# Patient Record
Sex: Female | Born: 1975
Health system: Southern US, Community
[De-identification: ages and names within clinical notes are randomized; demographics above are authoritative.]

---

## 2002-08-13 ENCOUNTER — Ambulatory Visit (HOSPITAL_COMMUNITY): Admission: RE | Admit: 2002-08-13 | Discharge: 2002-08-13 | Payer: Self-pay | Admitting: Family Medicine

## 2003-07-28 ENCOUNTER — Other Ambulatory Visit: Admission: RE | Admit: 2003-07-28 | Discharge: 2003-07-28 | Payer: Self-pay | Admitting: Family Medicine

## 2004-08-11 ENCOUNTER — Encounter: Admission: RE | Admit: 2004-08-11 | Discharge: 2004-08-11 | Payer: Self-pay | Admitting: Family Medicine

## 2004-10-07 ENCOUNTER — Other Ambulatory Visit: Admission: RE | Admit: 2004-10-07 | Discharge: 2004-10-07 | Payer: Self-pay | Admitting: Obstetrics and Gynecology

## 2005-11-22 ENCOUNTER — Inpatient Hospital Stay (HOSPITAL_COMMUNITY): Admission: AD | Admit: 2005-11-22 | Discharge: 2005-11-25 | Payer: Self-pay | Admitting: Obstetrics and Gynecology

## 2008-05-16 ENCOUNTER — Inpatient Hospital Stay (HOSPITAL_COMMUNITY): Admission: RE | Admit: 2008-05-16 | Discharge: 2008-05-18 | Payer: Self-pay | Admitting: Obstetrics and Gynecology

## 2010-08-16 LAB — CBC
HCT: 35.8 % — ABNORMAL LOW (ref 36.0–46.0)
MCHC: 33.2 g/dL (ref 30.0–36.0)
Platelets: 139 10*3/uL — ABNORMAL LOW (ref 150–400)
RDW: 13.7 % (ref 11.5–15.5)

## 2010-08-16 LAB — URINALYSIS, ROUTINE W REFLEX MICROSCOPIC
Hgb urine dipstick: NEGATIVE
Ketones, ur: 15 mg/dL — AB
Leukocytes, UA: NEGATIVE
Specific Gravity, Urine: 1.02 (ref 1.005–1.030)
pH: 6.5 (ref 5.0–8.0)

## 2010-08-16 LAB — URINE MICROSCOPIC-ADD ON

## 2010-08-16 LAB — RPR: RPR Ser Ql: NONREACTIVE

## 2010-08-16 LAB — CCBB MATERNAL DONOR DRAW

## 2010-09-14 NOTE — Op Note (Signed)
April Perkins, April Perkins             ACCOUNT NO.:  [id number]   MEDICAL RECORD NO.:  [id number]          PATIENT TYPE:  INP   LOCATION:  9107                          FACILITY:  [facility]   PHYSICIAN:  Randye Lobo, M.D.   DATE OF BIRTH:  25-Mar-1976   DATE OF PROCEDURE:  DATE OF DISCHARGE:                               OPERATIVE REPORT   PREOPERATIVE DIAGNOSES:  1. Intrauterine gestation at 16 plus 2 weeks.  2. History of prior cesarean section, desires repeat cesarean section.   POSTOPERATIVE DIAGNOSES:  1. Intrauterine gestation at 37 plus 2 weeks.  2. History of prior cesarean section, desires repeat cesarean section.   PROCEDURE:  Primary low segment transverse cesarean section.   SURGEON:  Randye Lobo, MD   ASSISTANT:  Luvenia Redden, MD   ANESTHESIA:  Spinal.   IV FLUIDS:  __________.   ESTIMATED BLOOD LOSS:  800 mL.   URINE OUTPUT:  Per Foley catheter.   COMPLICATIONS:  None.   INDICATIONS FOR PROCEDURE:  The patient is a 35 year old gravida 2, para  1-0-0-1 Caucasian female at 44 plus 2 weeks' gestation who presents for  a repeat cesarean section.  The patient's antepartum course has been  remarkable for some right lower extremity varicosities.  A plan is now  made to proceed with a repeat cesarean section after risks, benefits,  and alternatives are reviewed.   FINDINGS:  A viable female was delivered at 7:43 a.m. with Apgars of 9  at one minute and 9 at five minutes.  The weight was 8 pounds 14 ounces.  The position was right occiput anterior.  The amniotic fluid was clear.  The placenta had a normal insertion of a three-vessel cord.  There was a  body cord x1 which was loose.  The uterus, tubes, and ovaries were  unremarkable.   SPECIMEN:  None.   PROCEDURE:  The patient was reidentified in the preoperative hold area.  The patient received TED hose for DVT prophylaxis and she received Ancef  1 g IV for preoperative antibiotics.   In the operating room, a  spinal anesthetic was administered and the  patient was then placed in the supine position with a left lateral tilt.  The abdomen was sterilely prepped and a Foley catheter placed inside the  bladder.  She was then sterilely draped.   A Pfannenstiel incision was created sharply with the scalpel.  The  incision was carried down to the fascia with a scalpel and with  monopolar cautery for hemostasis.  The fascia was incised in the midline  and the incision was extended bilaterally.  The rectus muscles were  separated from the fascia superiorly and inferiorly.  The rectus muscles  were sharply divided in the midline.  The parietal peritoneum was  elevated with two hemostat clamps and entered sharply.  The peritoneal  incision was extended cranially and caudally.   The lower uterine segment was exposed.  The bladder flap was sharply  created.  A transverse lower uterine segment incision was created  sharply with a scalpel and extended bluntly.  A hand was inserted  through the uterine incision, and the vertex was elevated slightly.  There was some tissue dystocia of the right rectus muscle, and this was  therefore partially bisected in a sharp fashion.  The vertex was then  delivered without difficulty with the assistance of fundal pressure.  The remainder of the newborn was delivered.  The nares and mouth were  suctioned.  The umbilical cord was doubly clamped and cut and the  newborn was carried over to the awaiting pediatricians in vigorous  condition.   The placenta was manually extracted.  The uterine cavity was wiped  clean, and there were no remaining products of conception appreciated.   The uterus was exteriorized at this time, and it was closed with a  double layer closure of #1 chromic.  The first was a running locked  layer and the second was an imbricating layer.  Hemostasis was good at  this time and the uterus was therefore returned to the peritoneal  cavity, which was  irrigated and suctioned.   The abdomen was closed at this time.  The parietal peritoneum was closed  with a running suture of 3-0 Vicryl.  The rectus muscles were  reapproximated using a combination of figure-of-eight sutures and 0  figure-of-eight sutures and simple interrupted sutures of #1 chromic.  The bisected portion of the rectus muscle was repaired in this fashion  and the rectus muscles were brought together in the midline.   The fascia was closed with a running suture of 0 Vicryl.  The  subcutaneous layer was irrigated and suctioned and made hemostatic with  monopolar cautery.  It was closed with interrupted sutures of 2-0 plain  gut suture.  Staples were placed over the skin.  This concluded the  patient's procedure.  There were no complications.  All needle,  instrument, and sponge counts were correct.      Randye Lobo, M.D.  Electronically Signed     BES/MEDQ  D:  05/16/2008  T:  05/16/2008  Job:  [id number]

## 2010-09-17 NOTE — Op Note (Signed)
April Perkins, April Perkins             ACCOUNT NO.:  [id number]   MEDICAL RECORD NO.:  [id number]          PATIENT TYPE:  INP   LOCATION:  9112                          FACILITY:  [facility]   PHYSICIAN:  Miguel Aschoff, M.D.       DATE OF BIRTH:  06-20-1975   DATE OF PROCEDURE:  11/22/2005  DATE OF DISCHARGE:                                 OPERATIVE REPORT   PREOPERATIVE DIAGNOSIS:  Intrauterine pregnancy at 41-5/7 weeks, suspected  macrosomia, early labor.   POSTOPERATIVE DIAGNOSIS:  Intrauterine pregnancy at 41-5/7 weeks, suspected  macrosomia, early labor with viable female infant Apgar 9/9, weighing 8  pounds 10 ounces.   PROCEDURE:  Primary low flap transverse cesarean section.   SURGEON:  Dr. Miguel Aschoff.   ANESTHESIA:  Spinal.   COMPLICATIONS:  None.   JUSTIFICATION:  The patient is a 35 year old white female gravida 1, para 0  with estimated date of confinement of 11/10/2005.  The patient was noted to  be 41 and 5/7 weeks and had undergone an ultrasound evaluation which  revealed a estimated fetal weight 9 pounds 10 ounces.  The patient has been  scheduled for primary cesarean section for suspected macrosomia 11/23/2032  but went into labor spontaneously on November 22, 2005.  With the onset of labor  she is being taken to the operating room at this time to undergo cesarean  section for the suspected macrosomia.  The risks and benefits have been  discussed with the patient of the surgical procedure as well as the  uncertainty of estimated fetal weights with ultrasound.   PROCEDURE:  The patient is taken to the operating room, placed in sitting  position and spinal anesthesia was administered without difficulty.  She was  then placed in supine position deviated to the left and prepped and draped  in usual sterile fashion.  Foley catheter was inserted.  The patient then  prepped, draped in usual sterile fashion.  A Pfannenstiel incision was made  extending down through subcutaneous  tissue with bleeding points being  clamped and coagulation as they were encountered.  Fascia was then  identified, incised transversely and separated from the underlying rectus  muscles.  Rectus muscles were divided in midline.  Peritoneum was then found  and entered carefully avoiding underlying structures.  Bladder flap was then  created and protected with a bladder blade.  An elliptical transverse  incision was made into lower uterine segment, amniotic cavity was entered.  Clear fluid was obtained and at this point the patient was delivered of a  viable female infant Apgar 9 at one minute and 9 at five minutes from the  vertex LOA position.  Baby was noted to have an excessively long umbilical  cord but otherwise, the baby was vigorous and handed to the neonatal team in  attendance.  Cord bloods were obtained for appropriate studies.  The  placenta was then delivered.  The uterus was then evacuated and remaining  products of conception.  At this point the angles of the uterine incision  were ligated using figure-of-eight sutures of #1 Vicryl.  Then the uterus  was closed in layers.  The first layer was running interlocking suture of #1  Vicryl followed by an imbricating suture of #1 Vicryl.  Bladder flap was  reapproximated using running continuous 2-0 Vicryl suture.  At this point  lap counts, instrument counts taken found to be correct.  Tubes and ovaries  were inspected noted to be within normal limits.  Then the abdomen was  closed.  The parietal peritoneum was closed using running continuous 0  Vicryl suture.  Rectus muscles were reapproximated using running continuous  0 Vicryl suture.  The fascia was then closed using two sutures of 0 Vicryl  each starting lateral fascial angles and meeting in midline.  Subcutaneous  tissue was closed  using interrupted 2-0 Vicryl sutures and the skin incision was closed using  staples.  The estimated blood loss from the procedure was  approximately 600  to 700 mL.  The patient tolerated the procedure well went to the recovery  room in satisfactory condition.      Miguel Aschoff, M.D.  Electronically Signed     AR/MEDQ  D:  11/22/2005  T:  11/23/2005  Job:  [id number]

## 2010-09-17 NOTE — Discharge Summary (Signed)
NAMEKAMYIAH, April Perkins             ACCOUNT NO.:  [id number]   MEDICAL RECORD NO.:  [id number]          PATIENT TYPE:  INP   LOCATION:  9112                          FACILITY:  [facility]   PHYSICIAN:  Malva Limes, M.D.    DATE OF BIRTH:  1976-02-28   DATE OF ADMISSION:  11/22/2005  DATE OF DISCHARGE:  11/25/2005                                 DISCHARGE SUMMARY   FINAL DIAGNOSES:  1. Intrauterine pregnancy at 41-5/[redacted] weeks gestation.  2. Macrosomia.  3. Early labor.   PROCEDURE:  Primary low flap transverse cesarean section.  Surgeon Dr. Miguel Aschoff.  Complications none.   This 35 year old, G1, P0 presents at 41-5/[redacted] weeks gestation in early labor.  The patient had had an ultrasound revealing an estimated weight in the  office of 9 pounds and 10 ounces was scheduled for a cesarean section on the  25th. She happened to go into  labor spontaneously on the 24th and therefore a discussion held with the  patient and they decided to proceed with a cesarean section at this time.  The patient's antepartum course up to this point had been uncomplicated. The  patient did have a positive group B strep culture obtained in our office at  36 weeks. She was admitted. She was taken to the operating room on November 22, 2005 by Dr. Miguel Aschoff where a primary low flap transverse cesarean section  was performed with the delivery of an 8 pound 10 ounce female infant with  Apgar's of 9 and 9. The delivery went without complications. The patient's  postoperative course was benign without any significant fevers.  She was  felt ready for discharge on postoperative day #3. She was sent home on a  regular diet, told to decrease activities, told to continue her prenatal  vitamins and was given Percocet 1-2 every 4-6 hours as needed for the pain,  told she could use ibuprofen up to 600 mg every  6 hours as needed for pain  and was to follow-up in our office in 4 weeks.  Precautions were reviewed.   LABS ON DISCHARGE:   The patient had a hemoglobin of 11.1, white blood cell  count of 12.6, platelets of 150,000.      Leilani Able, P.A.-C.    ______________________________  Malva Limes, M.D.    MB/MEDQ  D:  12/29/2005  T:  12/30/2005  Job:  [id number]

## 2010-09-17 NOTE — Discharge Summary (Signed)
NAMELAKEITHIA, April Perkins             ACCOUNT NO.:  [id number]   MEDICAL RECORD NO.:  [id number]          PATIENT TYPE:  INP   LOCATION:  9107                          FACILITY:  [facility]   PHYSICIAN:  Carrington Clamp, M.D. DATE OF BIRTH:  1975/12/10   DATE OF ADMISSION:  05/16/2008  DATE OF DISCHARGE:  05/18/2008                               DISCHARGE SUMMARY   FINAL DIAGNOSIS:  Intrauterine gestation at 39-2/7th weeks and history  of prior cesarean section.  The patient desires repeat cesarean section.   PROCEDURE:  Primary low-segment transverse cesarean section.   SURGEON:  Randye Lobo, MD   ASSISTANT:  Luvenia Redden, MD   COMPLICATIONS:  None.   This 35 year old G2, [name] presents at 39-2/7th weeks' gestation for  repeat cesarean section.  The patient had a prior cesarean section with  her last pregnancy and desires a repeat with this pregnancy as well.  The patient's antepartum course up to this point had been mostly  uncomplicated, besides some right lower extremity varicosities.  The  patient was taken to the operating room on May 16, 2008, by Dr.  Conley Simmonds, where a primary low-segment transverse cesarean section was  performed with the delivery of an 8 pounds 14 ounces female infant with  Apgars of 9 and 9.  The delivery went without complications.  The  patient's postoperative course was benign without any significant  fevers.  She did have some postoperative anemia, was started on iron  during her hospital course.  She was felt ready for discharge on  postoperative day #2.  She was sent home on a regular diet, told to  decrease activities, told to continue her vitamins, and her iron  supplement twice daily.  She was given a prescription for Darvocet-N 100  to take 1 every 4-6 hours as needed for pain, told she could use Motrin  up to 800 mg 1 every 8 hours as needed for pain, and was to follow up in  our office Monday for her staple removal.  Instructions and  precautions  were reviewed with the patient.   LABS ON DISCHARGE:  The patient had a hemoglobin of 8.5, white blood  cell count of 8.8, and platelets of 139,000.      Leilani Able, P.A.-C.      Carrington Clamp, M.D.  Electronically Signed    MB/MEDQ  D:  06/11/2008  T:  06/12/2008  Job:  [id number]

## 2013-05-08 ENCOUNTER — Other Ambulatory Visit: Payer: Self-pay | Admitting: Physician Assistant

## 2013-05-08 ENCOUNTER — Other Ambulatory Visit (HOSPITAL_COMMUNITY)
Admission: RE | Admit: 2013-05-08 | Discharge: 2013-05-08 | Disposition: A | Discharge status: Home / Self Care | Payer: Managed Care, Other (non HMO) | Source: Ambulatory Visit | Attending: Family Medicine | Admitting: Family Medicine

## 2013-05-08 DIAGNOSIS — Z124 Encounter for screening for malignant neoplasm of cervix: Secondary | ICD-10-CM | POA: Insufficient documentation

## 2015-08-06 DIAGNOSIS — M79671 Pain in right foot: Secondary | ICD-10-CM | POA: Diagnosis not present

## 2015-09-13 DIAGNOSIS — H6980 Other specified disorders of Eustachian tube, unspecified ear: Secondary | ICD-10-CM | POA: Diagnosis not present

## 2015-10-01 DIAGNOSIS — N920 Excessive and frequent menstruation with regular cycle: Secondary | ICD-10-CM | POA: Diagnosis not present

## 2016-01-07 DIAGNOSIS — D2271 Melanocytic nevi of right lower limb, including hip: Secondary | ICD-10-CM | POA: Diagnosis not present

## 2016-01-07 DIAGNOSIS — D225 Melanocytic nevi of trunk: Secondary | ICD-10-CM | POA: Diagnosis not present

## 2016-01-07 DIAGNOSIS — L719 Rosacea, unspecified: Secondary | ICD-10-CM | POA: Diagnosis not present

## 2016-01-07 DIAGNOSIS — L821 Other seborrheic keratosis: Secondary | ICD-10-CM | POA: Diagnosis not present

## 2016-01-15 DIAGNOSIS — G5751 Tarsal tunnel syndrome, right lower limb: Secondary | ICD-10-CM | POA: Diagnosis not present

## 2016-02-11 DIAGNOSIS — G5751 Tarsal tunnel syndrome, right lower limb: Secondary | ICD-10-CM | POA: Diagnosis not present

## 2016-02-11 DIAGNOSIS — Z23 Encounter for immunization: Secondary | ICD-10-CM | POA: Diagnosis not present

## 2016-02-11 DIAGNOSIS — G5702 Lesion of sciatic nerve, left lower limb: Secondary | ICD-10-CM | POA: Diagnosis not present

## 2016-02-26 DIAGNOSIS — Z111 Encounter for screening for respiratory tuberculosis: Secondary | ICD-10-CM | POA: Diagnosis not present

## 2016-03-05 DIAGNOSIS — H109 Unspecified conjunctivitis: Secondary | ICD-10-CM | POA: Diagnosis not present

## 2016-08-16 DIAGNOSIS — Z131 Encounter for screening for diabetes mellitus: Secondary | ICD-10-CM | POA: Diagnosis not present

## 2016-08-16 DIAGNOSIS — Z13220 Encounter for screening for lipoid disorders: Secondary | ICD-10-CM | POA: Diagnosis not present

## 2016-08-17 ENCOUNTER — Other Ambulatory Visit: Payer: Self-pay | Admitting: Family Medicine

## 2016-08-17 ENCOUNTER — Other Ambulatory Visit (HOSPITAL_COMMUNITY)
Admission: RE | Admit: 2016-08-17 | Discharge: 2016-08-17 | Disposition: A | Discharge status: Home / Self Care | Payer: BLUE CROSS/BLUE SHIELD | Source: Ambulatory Visit | Attending: Family Medicine | Admitting: Family Medicine

## 2016-08-17 DIAGNOSIS — Z818 Family history of other mental and behavioral disorders: Secondary | ICD-10-CM | POA: Diagnosis not present

## 2016-08-17 DIAGNOSIS — Z124 Encounter for screening for malignant neoplasm of cervix: Secondary | ICD-10-CM | POA: Diagnosis not present

## 2016-08-17 DIAGNOSIS — N920 Excessive and frequent menstruation with regular cycle: Secondary | ICD-10-CM | POA: Diagnosis not present

## 2016-08-17 DIAGNOSIS — Z0000 Encounter for general adult medical examination without abnormal findings: Secondary | ICD-10-CM | POA: Diagnosis not present

## 2016-08-18 LAB — CYTOLOGY - PAP: Diagnosis: NEGATIVE

## 2016-08-23 DIAGNOSIS — J0140 Acute pansinusitis, unspecified: Secondary | ICD-10-CM | POA: Diagnosis not present

## 2016-08-23 DIAGNOSIS — R05 Cough: Secondary | ICD-10-CM | POA: Diagnosis not present

## 2016-12-07 DIAGNOSIS — R5383 Other fatigue: Secondary | ICD-10-CM | POA: Diagnosis not present

## 2016-12-07 DIAGNOSIS — Z8349 Family history of other endocrine, nutritional and metabolic diseases: Secondary | ICD-10-CM | POA: Diagnosis not present

## 2016-12-07 DIAGNOSIS — R221 Localized swelling, mass and lump, neck: Secondary | ICD-10-CM | POA: Diagnosis not present

## 2016-12-08 ENCOUNTER — Other Ambulatory Visit: Payer: Self-pay | Admitting: Family Medicine

## 2016-12-08 DIAGNOSIS — R221 Localized swelling, mass and lump, neck: Secondary | ICD-10-CM

## 2016-12-15 ENCOUNTER — Ambulatory Visit
Admission: RE | Admit: 2016-12-15 | Discharge: 2016-12-15 | Disposition: A | Discharge status: Home / Self Care | Payer: BLUE CROSS/BLUE SHIELD | Source: Ambulatory Visit | Attending: Family Medicine | Admitting: Family Medicine

## 2016-12-15 DIAGNOSIS — R221 Localized swelling, mass and lump, neck: Secondary | ICD-10-CM

## 2016-12-15 DIAGNOSIS — E042 Nontoxic multinodular goiter: Secondary | ICD-10-CM | POA: Diagnosis not present

## 2017-01-17 DIAGNOSIS — D2271 Melanocytic nevi of right lower limb, including hip: Secondary | ICD-10-CM | POA: Diagnosis not present

## 2017-01-17 DIAGNOSIS — D225 Melanocytic nevi of trunk: Secondary | ICD-10-CM | POA: Diagnosis not present

## 2017-01-17 DIAGNOSIS — L719 Rosacea, unspecified: Secondary | ICD-10-CM | POA: Diagnosis not present

## 2017-01-17 DIAGNOSIS — L821 Other seborrheic keratosis: Secondary | ICD-10-CM | POA: Diagnosis not present

## 2017-03-04 DIAGNOSIS — Z23 Encounter for immunization: Secondary | ICD-10-CM | POA: Diagnosis not present

## 2017-11-22 DIAGNOSIS — J208 Acute bronchitis due to other specified organisms: Secondary | ICD-10-CM | POA: Diagnosis not present

## 2017-12-01 DIAGNOSIS — R0602 Shortness of breath: Secondary | ICD-10-CM | POA: Diagnosis not present

## 2017-12-01 DIAGNOSIS — J209 Acute bronchitis, unspecified: Secondary | ICD-10-CM | POA: Diagnosis not present

## 2018-02-01 DIAGNOSIS — D225 Melanocytic nevi of trunk: Secondary | ICD-10-CM | POA: Diagnosis not present

## 2018-02-01 DIAGNOSIS — D2271 Melanocytic nevi of right lower limb, including hip: Secondary | ICD-10-CM | POA: Diagnosis not present

## 2018-02-01 DIAGNOSIS — L719 Rosacea, unspecified: Secondary | ICD-10-CM | POA: Diagnosis not present

## 2018-02-01 DIAGNOSIS — Z23 Encounter for immunization: Secondary | ICD-10-CM | POA: Diagnosis not present

## 2018-02-13 ENCOUNTER — Other Ambulatory Visit: Payer: Self-pay | Admitting: Family Medicine

## 2018-02-13 DIAGNOSIS — Z1231 Encounter for screening mammogram for malignant neoplasm of breast: Secondary | ICD-10-CM

## 2018-03-05 DIAGNOSIS — J4530 Mild persistent asthma, uncomplicated: Secondary | ICD-10-CM | POA: Diagnosis not present

## 2018-03-05 DIAGNOSIS — M79671 Pain in right foot: Secondary | ICD-10-CM | POA: Diagnosis not present

## 2018-03-05 DIAGNOSIS — J301 Allergic rhinitis due to pollen: Secondary | ICD-10-CM | POA: Diagnosis not present

## 2018-03-05 DIAGNOSIS — Z0000 Encounter for general adult medical examination without abnormal findings: Secondary | ICD-10-CM | POA: Diagnosis not present

## 2018-03-05 DIAGNOSIS — Z131 Encounter for screening for diabetes mellitus: Secondary | ICD-10-CM | POA: Diagnosis not present

## 2018-03-07 ENCOUNTER — Other Ambulatory Visit: Payer: Self-pay | Admitting: Family Medicine

## 2018-03-07 DIAGNOSIS — E041 Nontoxic single thyroid nodule: Secondary | ICD-10-CM

## 2018-03-12 ENCOUNTER — Ambulatory Visit
Admission: RE | Admit: 2018-03-12 | Discharge: 2018-03-12 | Disposition: A | Discharge status: Home / Self Care | Payer: BLUE CROSS/BLUE SHIELD | Source: Ambulatory Visit | Attending: Family Medicine | Admitting: Family Medicine

## 2018-03-12 DIAGNOSIS — E041 Nontoxic single thyroid nodule: Secondary | ICD-10-CM

## 2018-03-21 ENCOUNTER — Ambulatory Visit
Admission: RE | Admit: 2018-03-21 | Discharge: 2018-03-21 | Disposition: A | Discharge status: Home / Self Care | Payer: BLUE CROSS/BLUE SHIELD | Source: Ambulatory Visit | Attending: Family Medicine | Admitting: Family Medicine

## 2018-03-21 DIAGNOSIS — Z1231 Encounter for screening mammogram for malignant neoplasm of breast: Secondary | ICD-10-CM

## 2018-03-26 ENCOUNTER — Encounter: Payer: Self-pay | Admitting: Podiatry

## 2018-03-26 ENCOUNTER — Ambulatory Visit (INDEPENDENT_AMBULATORY_CARE_PROVIDER_SITE_OTHER): Payer: BLUE CROSS/BLUE SHIELD | Admitting: Podiatry

## 2018-03-26 ENCOUNTER — Ambulatory Visit (INDEPENDENT_AMBULATORY_CARE_PROVIDER_SITE_OTHER): Payer: BLUE CROSS/BLUE SHIELD

## 2018-03-26 DIAGNOSIS — M722 Plantar fascial fibromatosis: Secondary | ICD-10-CM

## 2018-03-26 DIAGNOSIS — G5751 Tarsal tunnel syndrome, right lower limb: Secondary | ICD-10-CM

## 2018-03-26 MED ORDER — TRIAMCINOLONE ACETONIDE 10 MG/ML IJ SUSP
10.0000 mg | Freq: Once | INTRAMUSCULAR | Status: AC
  Administered 2018-03-26: 10 mg

## 2018-03-26 MED ORDER — MELOXICAM 15 MG PO TABS: 15.0000 mg | ORAL_TABLET | Freq: Every day | ORAL | 0 refills | Status: AC

## 2018-03-26 NOTE — Progress Notes (Signed)
Subjective:    Patient ID: April Perkins, female    DOB: 12-Nov-1975, 42 y.o.   MRN: [id number]  HPI 42 year old female presents the office today for concerns of pain to the right heel.  She states that she has pain in the bottom of her heel.  Is been ongoing for last couple years and she was also told that she had a tarsal tunnel previously.  She states that she gets intermittent discomfort and she has just been "living with it".  She did previously discuss this with her primary care physician.  She denies any recent injury or trauma.  She has some numbness to the second third toes on the right foot but overall she has no other areas of numbness or tingling or burning.  No significant swelling.  The pain does not wake her up at night.  She has no other concerns.   Review of Systems  All other systems reviewed and are negative.  History reviewed. No pertinent past medical history.  History reviewed. No pertinent surgical history.   Current Outpatient Medications:  .  Acetaminophen (TYLENOL PO), Take by mouth., Disp: , Rfl:  .  guaiFENesin (MUCINEX PO), Take by mouth., Disp: , Rfl:  .  Ibuprofen (ADVIL PO), Take by mouth., Disp: , Rfl:  .  drospirenone-ethinyl estradiol (YASMIN,ZARAH,SYEDA) 3-0.03 MG tablet, TAKE 1 TABLET BY MOUTH ONCE A DAY - ACTIVE PILLS TAKEN IN CONTINUOUS FASHION, Disp: , Rfl: 1 .  meloxicam (MOBIC) 15 MG tablet, Take 1 tablet (15 mg total) by mouth daily., Disp: 30 tablet, Rfl: 0 .  montelukast (SINGULAIR) 10 MG tablet, Take 10 mg by mouth daily., Disp: , Rfl: 12  Allergies  Allergen Reactions  . Codeine          Objective:   Physical Exam  General: AAO x3, NAD  Dermatological: Skin is warm, dry and supple bilateral. Nails x 10 are well manicured; remaining integument appears unremarkable at this time. There are no open sores, no preulcerative lesions, no rash or signs of infection present.  Vascular: Dorsalis Pedis artery and Posterior Tibial artery  pedal pulses are 2/4 bilateral with immedate capillary fill time. Pedal hair growth present. No varicosities and no lower extremity edema present bilateral. There is no pain with calf compression, swelling, warmth, erythema.   Neruologic: Grossly intact via light touch bilateral. Vibratory intact via tuning fork bilateral. Protective threshold with Semmes Wienstein monofilament intact to all pedal sites bilateral.  Mildly positive Tinel sign on the right side.  Negative on the left side.  Some subjective numbness to the second and third toes but overall sensation intact to the toes.  Musculoskeletal: Tenderness to palpation along the plantar medial tubercle of the calcaneus at the insertion of plantar fascia on the right foot. There is no pain along the course of the plantar fascia within the arch of the foot. Plantar fascia appears to be intact. There is no pain with lateral compression of the calcaneus or pain with vibratory sensation. There is no pain along the course or insertion of the achilles tendon. No other areas of tenderness to bilateral lower extremities.  There is no palpable neuroma identified no pain to the interspaces.  muscular strength 5/5 in all groups tested bilateral.  Cavus foot type  Gait: Unassisted, Nonantalgic.      Assessment & Plan:  42 year old female right heel pain, plantar fasciitis with concern for tarsal tunnel  -Treatment options discussed including all alternatives, risks, and complications -Etiology of symptoms were  discussed -X-rays were obtained and reviewed with the patient.  There is no evidence of acute fracture or stress fracture identified today. -I do think she has plantar fasciitis and hopefully this is causing some entrapment of the nerve and this will resolve.  Unfortunately she had nerve issues for the last couple years and we discussed the longer that nerve issues of her symptomatic it is difficult to treat.  Given focus on plantar fascial treatment but  she has no improvement we will get a nerve conduction test. -Steroid injection performed right heel.  See procedure note below.  Also consider injection of the tarsal tunnel symptoms persist. -Prescribed mobic. Discussed side effects of the medication and directed to stop if any are to occur and call the office.  -Stretching, icing daily. -Discussed shoe modifications and orthotics.  Check insurance coverage.  Given her cavus foot type I think she will benefit more from custom orthotic. She has attempted OTC inserts.  -Plantar fascial brace dispensed.  Procedure: Injection Tendon/Ligament Discussed alternatives, risks, complications and verbal consent was obtained.  Location: Right plantar fascia at the glabrous junction; medial approach. Skin Prep: Alcohol. Injectate: 0.5cc 0.5% marcaine plain, 0.5 cc 2% lidocaine plain and, 1 cc kenalog 10. Disposition: Patient tolerated procedure well. Injection site dressed with a band-aid.  Post-injection care was discussed and return precautions discussed.   Vivi Barrack DPM

## 2018-03-26 NOTE — Patient Instructions (Signed)

## 2018-03-28 DIAGNOSIS — M722 Plantar fascial fibromatosis: Secondary | ICD-10-CM | POA: Insufficient documentation

## 2018-04-16 ENCOUNTER — Ambulatory Visit: Payer: BLUE CROSS/BLUE SHIELD | Admitting: Podiatry

## 2019-02-14 DIAGNOSIS — D225 Melanocytic nevi of trunk: Secondary | ICD-10-CM | POA: Diagnosis not present

## 2019-02-14 DIAGNOSIS — D485 Neoplasm of uncertain behavior of skin: Secondary | ICD-10-CM | POA: Diagnosis not present

## 2019-02-14 DIAGNOSIS — Z808 Family history of malignant neoplasm of other organs or systems: Secondary | ICD-10-CM | POA: Diagnosis not present

## 2019-02-14 DIAGNOSIS — Z23 Encounter for immunization: Secondary | ICD-10-CM | POA: Diagnosis not present

## 2019-02-14 DIAGNOSIS — L719 Rosacea, unspecified: Secondary | ICD-10-CM | POA: Diagnosis not present

## 2019-02-14 DIAGNOSIS — D224 Melanocytic nevi of scalp and neck: Secondary | ICD-10-CM | POA: Diagnosis not present

## 2019-03-18 ENCOUNTER — Other Ambulatory Visit: Payer: Self-pay | Admitting: Family Medicine

## 2019-03-18 DIAGNOSIS — Z0000 Encounter for general adult medical examination without abnormal findings: Secondary | ICD-10-CM | POA: Diagnosis not present

## 2019-03-18 DIAGNOSIS — E041 Nontoxic single thyroid nodule: Secondary | ICD-10-CM

## 2019-04-04 ENCOUNTER — Other Ambulatory Visit: Payer: BLUE CROSS/BLUE SHIELD

## 2019-04-10 ENCOUNTER — Ambulatory Visit
Admission: RE | Admit: 2019-04-10 | Discharge: 2019-04-10 | Disposition: A | Discharge status: Home / Self Care | Payer: Self-pay | Source: Ambulatory Visit | Attending: Family Medicine | Admitting: Family Medicine

## 2019-04-10 DIAGNOSIS — E041 Nontoxic single thyroid nodule: Secondary | ICD-10-CM

## 2019-05-27 ENCOUNTER — Ambulatory Visit: Payer: BC Managed Care – PPO | Attending: Internal Medicine

## 2019-05-27 DIAGNOSIS — Z20822 Contact with and (suspected) exposure to covid-19: Secondary | ICD-10-CM | POA: Diagnosis not present

## 2019-05-28 LAB — NOVEL CORONAVIRUS, NAA: SARS-CoV-2, NAA: NOT DETECTED

## 2019-07-30 ENCOUNTER — Other Ambulatory Visit: Payer: Self-pay | Admitting: Family Medicine

## 2019-07-30 DIAGNOSIS — Z1231 Encounter for screening mammogram for malignant neoplasm of breast: Secondary | ICD-10-CM

## 2019-08-12 ENCOUNTER — Ambulatory Visit
Admission: RE | Admit: 2019-08-12 | Discharge: 2019-08-12 | Disposition: A | Discharge status: Home / Self Care | Payer: BC Managed Care – PPO | Source: Ambulatory Visit

## 2019-08-12 ENCOUNTER — Other Ambulatory Visit: Payer: Self-pay

## 2019-08-12 DIAGNOSIS — Z1231 Encounter for screening mammogram for malignant neoplasm of breast: Secondary | ICD-10-CM | POA: Diagnosis not present

## 2020-01-14 DIAGNOSIS — Z6831 Body mass index (BMI) 31.0-31.9, adult: Secondary | ICD-10-CM | POA: Diagnosis not present

## 2020-01-14 DIAGNOSIS — Z124 Encounter for screening for malignant neoplasm of cervix: Secondary | ICD-10-CM | POA: Diagnosis not present

## 2020-01-14 DIAGNOSIS — Z01419 Encounter for gynecological examination (general) (routine) without abnormal findings: Secondary | ICD-10-CM | POA: Diagnosis not present

## 2020-01-16 DIAGNOSIS — Z23 Encounter for immunization: Secondary | ICD-10-CM | POA: Diagnosis not present

## 2020-01-16 DIAGNOSIS — R011 Cardiac murmur, unspecified: Secondary | ICD-10-CM | POA: Diagnosis not present

## 2020-01-20 ENCOUNTER — Other Ambulatory Visit (HOSPITAL_COMMUNITY): Payer: Self-pay | Admitting: Family Medicine

## 2020-01-20 DIAGNOSIS — R011 Cardiac murmur, unspecified: Secondary | ICD-10-CM

## 2020-01-23 ENCOUNTER — Ambulatory Visit (HOSPITAL_COMMUNITY)
Admission: RE | Admit: 2020-01-23 | Discharge: 2020-01-23 | Disposition: A | Discharge status: Home / Self Care | Payer: BC Managed Care – PPO | Source: Ambulatory Visit | Attending: Family Medicine | Admitting: Family Medicine

## 2020-01-23 ENCOUNTER — Other Ambulatory Visit: Payer: Self-pay

## 2020-01-23 DIAGNOSIS — I77819 Aortic ectasia, unspecified site: Secondary | ICD-10-CM | POA: Diagnosis not present

## 2020-01-23 DIAGNOSIS — R011 Cardiac murmur, unspecified: Secondary | ICD-10-CM | POA: Diagnosis not present

## 2020-01-23 LAB — ECHOCARDIOGRAM COMPLETE
Area-P 1/2: 3.03 cm2
Calc EF: 53.8 %
S' Lateral: 2.7 cm
Single Plane A2C EF: 57.4 %
Single Plane A4C EF: 49.9 %

## 2020-01-23 NOTE — Progress Notes (Signed)
  Echocardiogram 2D Echocardiogram has been performed.  April Perkins 01/23/2020, 11:13 AM

## 2020-01-28 ENCOUNTER — Other Ambulatory Visit: Payer: Self-pay | Admitting: Family Medicine

## 2020-01-28 DIAGNOSIS — R160 Hepatomegaly, not elsewhere classified: Secondary | ICD-10-CM

## 2020-01-30 ENCOUNTER — Ambulatory Visit
Admission: RE | Admit: 2020-01-30 | Discharge: 2020-01-30 | Disposition: A | Discharge status: Home / Self Care | Payer: BC Managed Care – PPO | Source: Ambulatory Visit | Attending: Family Medicine | Admitting: Family Medicine

## 2020-01-30 DIAGNOSIS — R160 Hepatomegaly, not elsewhere classified: Secondary | ICD-10-CM

## 2020-03-05 DIAGNOSIS — L719 Rosacea, unspecified: Secondary | ICD-10-CM | POA: Diagnosis not present

## 2020-03-05 DIAGNOSIS — D225 Melanocytic nevi of trunk: Secondary | ICD-10-CM | POA: Diagnosis not present

## 2020-03-05 DIAGNOSIS — L578 Other skin changes due to chronic exposure to nonionizing radiation: Secondary | ICD-10-CM | POA: Diagnosis not present

## 2020-03-05 DIAGNOSIS — L299 Pruritus, unspecified: Secondary | ICD-10-CM | POA: Diagnosis not present

## 2020-03-20 DIAGNOSIS — Z131 Encounter for screening for diabetes mellitus: Secondary | ICD-10-CM | POA: Diagnosis not present

## 2020-03-20 DIAGNOSIS — E041 Nontoxic single thyroid nodule: Secondary | ICD-10-CM | POA: Diagnosis not present

## 2020-03-20 DIAGNOSIS — Z13220 Encounter for screening for lipoid disorders: Secondary | ICD-10-CM | POA: Diagnosis not present

## 2020-03-24 DIAGNOSIS — E041 Nontoxic single thyroid nodule: Secondary | ICD-10-CM | POA: Diagnosis not present

## 2020-03-24 DIAGNOSIS — L719 Rosacea, unspecified: Secondary | ICD-10-CM | POA: Diagnosis not present

## 2020-03-24 DIAGNOSIS — J301 Allergic rhinitis due to pollen: Secondary | ICD-10-CM | POA: Diagnosis not present

## 2020-03-24 DIAGNOSIS — K760 Fatty (change of) liver, not elsewhere classified: Secondary | ICD-10-CM | POA: Diagnosis not present

## 2020-03-24 DIAGNOSIS — Z0000 Encounter for general adult medical examination without abnormal findings: Secondary | ICD-10-CM | POA: Diagnosis not present

## 2020-03-25 ENCOUNTER — Other Ambulatory Visit: Payer: Self-pay | Admitting: Family Medicine

## 2020-03-25 DIAGNOSIS — E041 Nontoxic single thyroid nodule: Secondary | ICD-10-CM

## 2020-04-13 ENCOUNTER — Other Ambulatory Visit: Payer: BC Managed Care – PPO

## 2020-04-26 DIAGNOSIS — Z03818 Encounter for observation for suspected exposure to other biological agents ruled out: Secondary | ICD-10-CM | POA: Diagnosis not present

## 2020-04-26 DIAGNOSIS — Z20822 Contact with and (suspected) exposure to covid-19: Secondary | ICD-10-CM | POA: Diagnosis not present

## 2020-05-07 ENCOUNTER — Other Ambulatory Visit: Payer: Self-pay

## 2020-07-28 ENCOUNTER — Ambulatory Visit
Admission: RE | Admit: 2020-07-28 | Discharge: 2020-07-28 | Disposition: A | Discharge status: Home / Self Care | Payer: BC Managed Care – PPO | Source: Ambulatory Visit | Attending: Family Medicine | Admitting: Family Medicine

## 2020-07-28 DIAGNOSIS — E041 Nontoxic single thyroid nodule: Secondary | ICD-10-CM

## 2021-04-06 ENCOUNTER — Other Ambulatory Visit: Payer: Self-pay | Admitting: Obstetrics and Gynecology

## 2021-04-06 DIAGNOSIS — Z0000 Encounter for general adult medical examination without abnormal findings: Secondary | ICD-10-CM | POA: Diagnosis not present

## 2021-04-06 DIAGNOSIS — Z23 Encounter for immunization: Secondary | ICD-10-CM | POA: Diagnosis not present

## 2021-04-06 DIAGNOSIS — R5383 Other fatigue: Secondary | ICD-10-CM | POA: Diagnosis not present

## 2021-04-06 DIAGNOSIS — Z1231 Encounter for screening mammogram for malignant neoplasm of breast: Secondary | ICD-10-CM

## 2021-04-06 DIAGNOSIS — K760 Fatty (change of) liver, not elsewhere classified: Secondary | ICD-10-CM | POA: Diagnosis not present

## 2021-04-06 DIAGNOSIS — R252 Cramp and spasm: Secondary | ICD-10-CM | POA: Diagnosis not present

## 2021-04-06 DIAGNOSIS — J4530 Mild persistent asthma, uncomplicated: Secondary | ICD-10-CM | POA: Diagnosis not present

## 2021-04-09 ENCOUNTER — Ambulatory Visit
Admission: RE | Admit: 2021-04-09 | Discharge: 2021-04-09 | Disposition: A | Discharge status: Home / Self Care | Payer: BC Managed Care – PPO | Source: Ambulatory Visit

## 2021-04-09 DIAGNOSIS — Z1231 Encounter for screening mammogram for malignant neoplasm of breast: Secondary | ICD-10-CM | POA: Diagnosis not present

## 2021-05-11 DIAGNOSIS — Z01419 Encounter for gynecological examination (general) (routine) without abnormal findings: Secondary | ICD-10-CM | POA: Diagnosis not present

## 2021-05-11 DIAGNOSIS — N760 Acute vaginitis: Secondary | ICD-10-CM | POA: Diagnosis not present

## 2021-05-26 DIAGNOSIS — H02823 Cysts of right eye, unspecified eyelid: Secondary | ICD-10-CM | POA: Diagnosis not present

## 2021-05-26 DIAGNOSIS — Q825 Congenital non-neoplastic nevus: Secondary | ICD-10-CM | POA: Diagnosis not present

## 2021-05-26 DIAGNOSIS — L719 Rosacea, unspecified: Secondary | ICD-10-CM | POA: Diagnosis not present

## 2021-05-26 DIAGNOSIS — L578 Other skin changes due to chronic exposure to nonionizing radiation: Secondary | ICD-10-CM | POA: Diagnosis not present

## 2021-07-13 ENCOUNTER — Other Ambulatory Visit: Payer: Self-pay | Admitting: Family Medicine

## 2021-07-13 DIAGNOSIS — E041 Nontoxic single thyroid nodule: Secondary | ICD-10-CM

## 2021-08-23 ENCOUNTER — Ambulatory Visit
Admission: RE | Admit: 2021-08-23 | Discharge: 2021-08-23 | Disposition: A | Discharge status: Home / Self Care | Payer: BC Managed Care – PPO | Source: Ambulatory Visit | Attending: Family Medicine | Admitting: Family Medicine

## 2021-08-23 DIAGNOSIS — E041 Nontoxic single thyroid nodule: Secondary | ICD-10-CM

## 2021-09-25 IMAGING — US US THYROID
1 series · 13 of 25 positions shown · non-contrast
Comparison: 04/10/2019 and 12/15/2016 thyroid ultrasound

CLINICAL DATA: Right thyroid nodule follow up

EXAM:
THYROID ULTRASOUND
TECHNIQUE: Ultrasound examination of the thyroid gland and adjacent soft
tissues was performed.

[Series 1: us thyroid · 0.05mm/px · 13 of 40 slices shown]
[im 1/40]
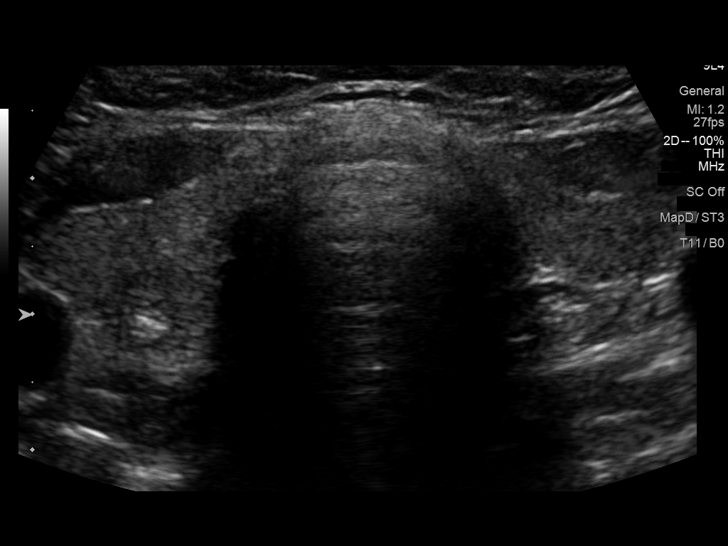
[im 4/40]
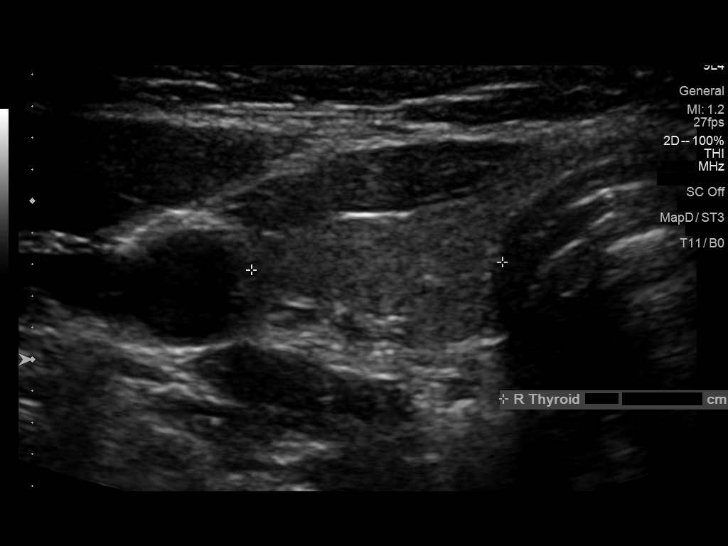
[im 7/40]
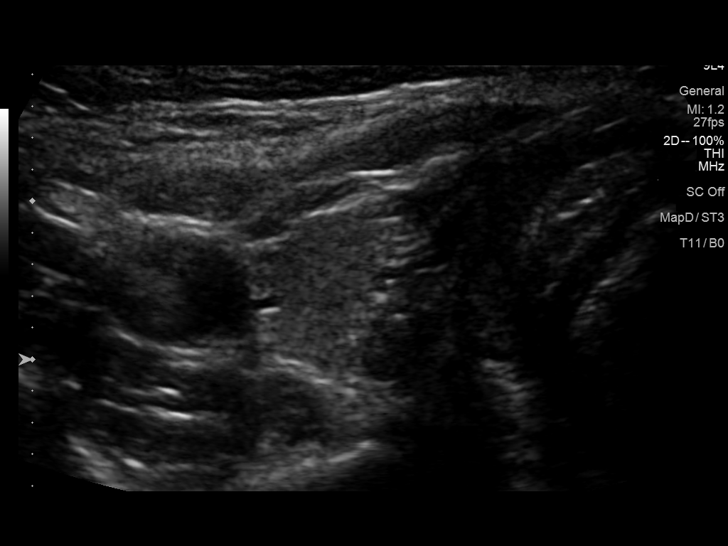
[im 10/40]
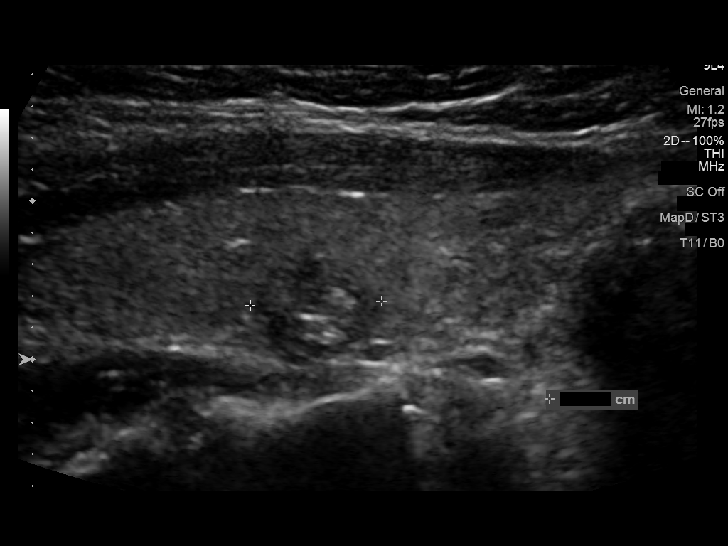
[im 14/40]
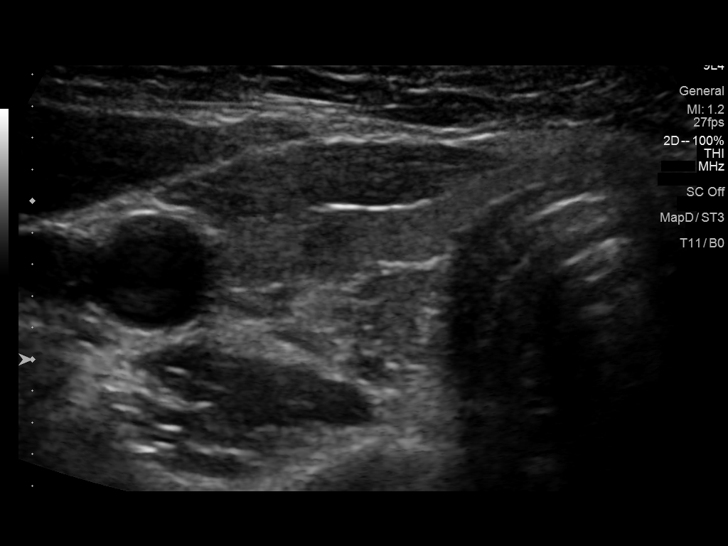
[im 17/40]
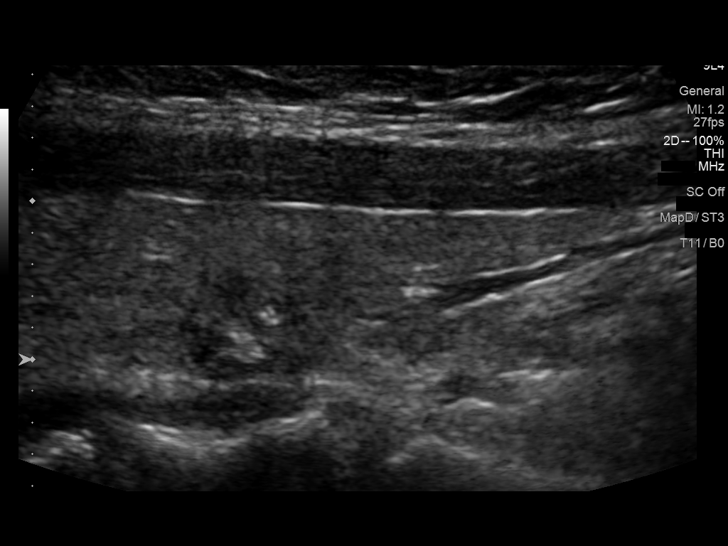
[im 20/40]
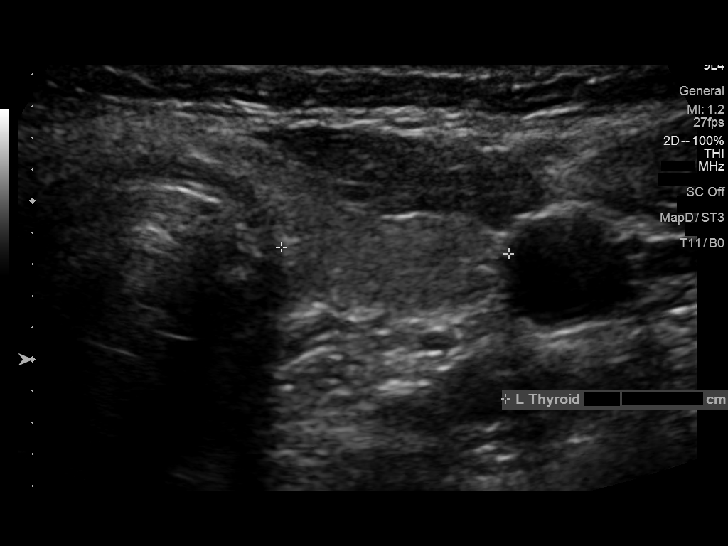
[im 23/40]
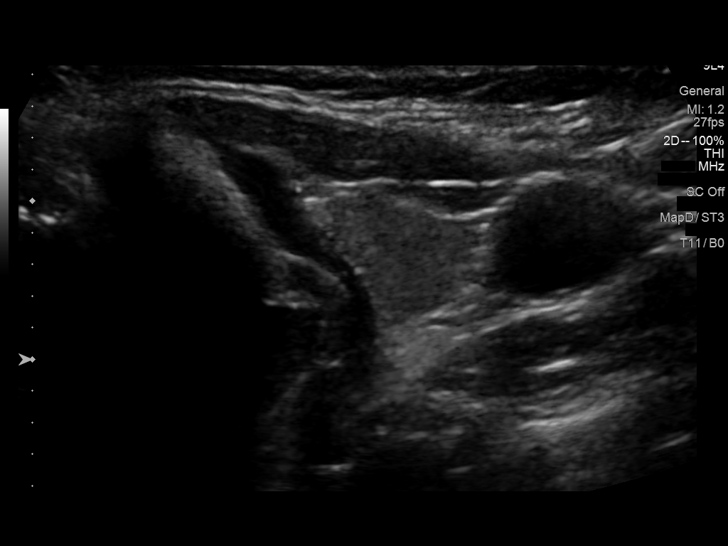
[im 27/40]
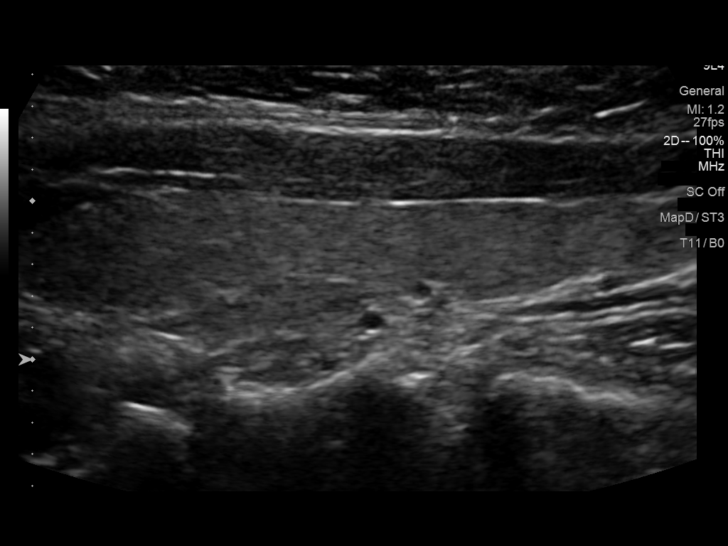
[im 30/40]
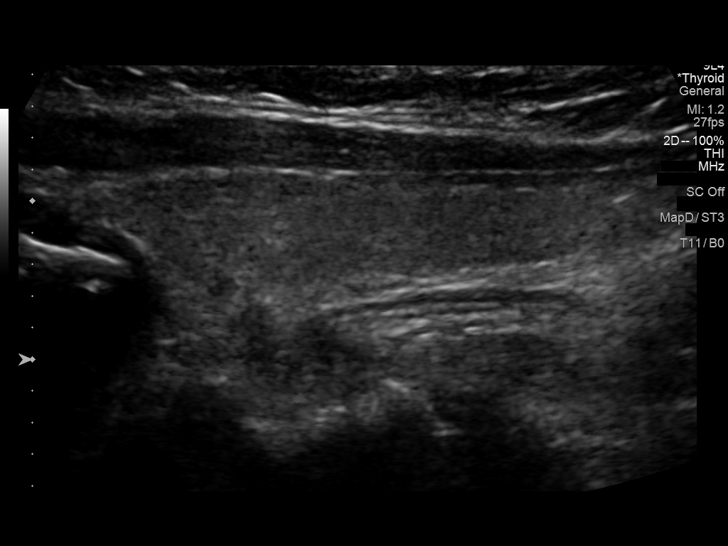
[im 33/40]
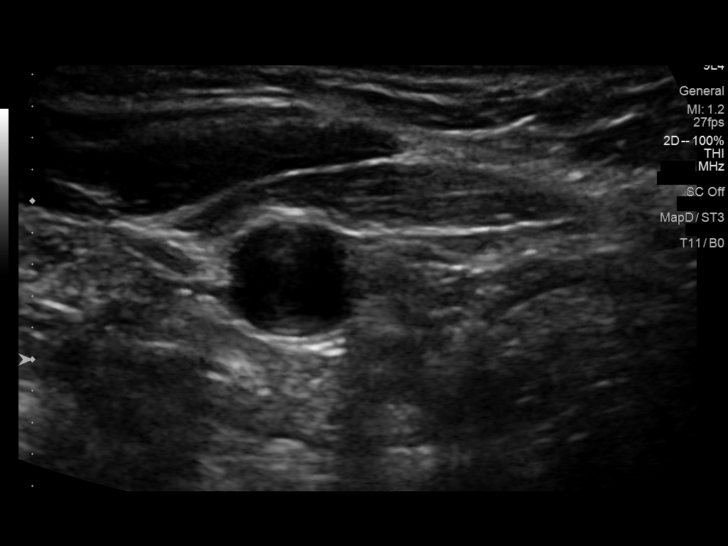
[im 36/40]
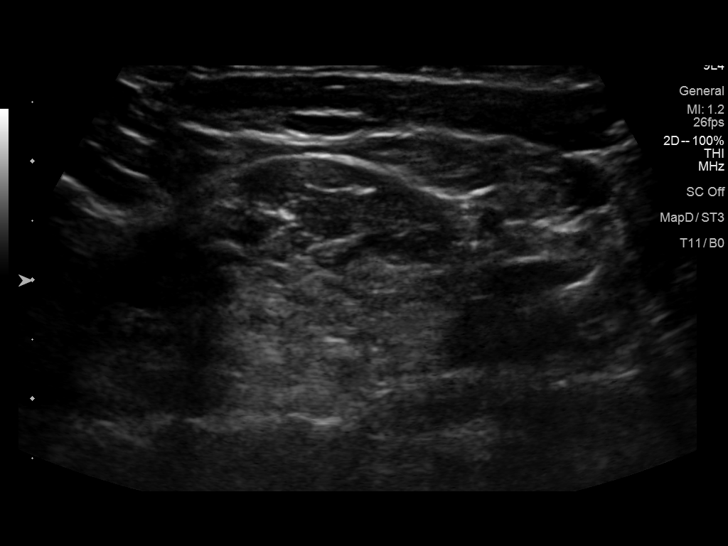
[im 40/40]
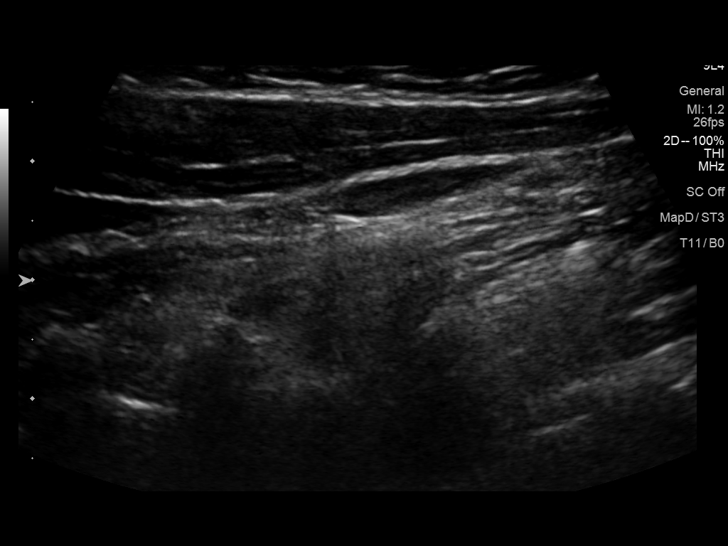

[13 of 25 positions shown; findings below may reference images not displayed]

FINDINGS: Parenchymal Echotexture: Normal

Isthmus: 0.3 cm

Right lobe: 4.1 x 0.8 x 1.6 cm

Left lobe: 5.0 x 0.9 x 1.4 cm

_________________________________________________________

Estimated total number of nodules >/= 1 cm: 0

Number of spongiform nodules >/=  2 cm not described below (TR1): 0

Number of mixed cystic and solid nodules >/= 1.5 cm not described
below (TR2): 0

_________________________________________________________

Nodule # 1:

Prior biopsy: No

Location: Right; mid

Maximum size: 0.8 cm; Other 2 dimensions: 0.7 x 0.7 cm, previously,
0.8 x 0.6 x 0.6 cm on 12/15/2016

Composition: solid/almost completely solid (2)

Echogenicity: hypoechoic (2)

Shape: not taller-than-wide (0)

Margins: smooth (0)

Echogenic foci: punctate echogenic foci (3)

ACR TI-RADS total points: 7.

ACR TI-RADS risk category:  TR5 (>/= 7 points).

Significant change in size (>/= 20% in two dimensions and minimal
increase of 2 mm): No

Change in features: No

Change in ACR TI-RADS risk category: No

ACR TI-RADS recommendations:

*Given size (>/= 0.5 - 0.9 cm) and appearance, a follow-up
ultrasound in 1 year should be considered based on TI-RADS criteria.

_________________________________________________________
IMPRESSION: Nodule 1 located in the mid right thyroid lobe is not significantly
changed in size since 12/15/2016. Ultrasound of the thyroid should
be performed next year to document 5 years of stability which would
confirm this as a benign nodule.

The above is in keeping with the ACR TI-RADS recommendations - [HOSPITAL] 5240;[DATE].

## 2022-03-11 ENCOUNTER — Ambulatory Visit: Payer: BC Managed Care – PPO | Admitting: Podiatry

## 2022-03-11 ENCOUNTER — Ambulatory Visit (INDEPENDENT_AMBULATORY_CARE_PROVIDER_SITE_OTHER): Payer: BC Managed Care – PPO

## 2022-03-11 DIAGNOSIS — M7731 Calcaneal spur, right foot: Secondary | ICD-10-CM

## 2022-03-11 DIAGNOSIS — M7751 Other enthesopathy of right foot: Secondary | ICD-10-CM

## 2022-03-11 DIAGNOSIS — M722 Plantar fascial fibromatosis: Secondary | ICD-10-CM

## 2022-03-11 DIAGNOSIS — M216X9 Other acquired deformities of unspecified foot: Secondary | ICD-10-CM

## 2022-03-11 MED ORDER — MELOXICAM 15 MG PO TABS: 15.0000 mg | ORAL_TABLET | Freq: Every day | ORAL | 0 refills | Status: DC | PRN

## 2022-03-11 MED ORDER — METHYLPREDNISOLONE 4 MG PO TBPK: ORAL_TABLET | ORAL | 0 refills | Status: AC

## 2022-03-11 NOTE — Progress Notes (Unsigned)
Subjective:   Patient ID: April Perkins, female   DOB: 46 y.o.   MRN: [id number]   HPI Chief Complaint  Patient presents with   Plantar Fasciitis    Bilateral heel pain started 6 months ago, rate of pain 9 out of 10 after walking all day, patient wakes up at night with pain, morning pain, some swelling, X-Rays taken today, TX: Icing, change shoes    It is worse on the right Gets pain right sub 2 pain  Feel diffferent than last time0 last time Feels like a strlp around the heel  6 months getting worse After gets moving it is better. 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

## 2022-03-11 NOTE — Patient Instructions (Signed)
Start with medrol dose pack and then once complete you can start mobic (the anti-inflammatory).   Plantar Fasciitis (Heel Spur Syndrome) with Rehab The plantar fascia is a fibrous, ligament-like, soft-tissue structure that spans the bottom of the foot. Plantar fasciitis is a condition that causes pain in the foot due to inflammation of the tissue. SYMPTOMS  Pain and tenderness on the underneath side of the foot. Pain that worsens with standing or walking. CAUSES  Plantar fasciitis is caused by irritation and injury to the plantar fascia on the underneath side of the foot. Common mechanisms of injury include: Direct trauma to bottom of the foot. Damage to a small nerve that runs under the foot where the main fascia attaches to the heel bone. Stress placed on the plantar fascia due to bone spurs. RISK INCREASES WITH:  Activities that place stress on the plantar fascia (running, jumping, pivoting, or cutting). Poor strength and flexibility. Improperly fitted shoes. Tight calf muscles. Flat feet. Failure to warm-up properly before activity. Obesity. PREVENTION Warm up and stretch properly before activity. Allow for adequate recovery between workouts. Maintain physical fitness: Strength, flexibility, and endurance. Cardiovascular fitness. Maintain a health body weight. Avoid stress on the plantar fascia. Wear properly fitted shoes, including arch supports for individuals who have flat feet.  PROGNOSIS  If treated properly, then the symptoms of plantar fasciitis usually resolve without surgery. However, occasionally surgery is necessary.  RELATED COMPLICATIONS  Recurrent symptoms that may result in a chronic condition. Problems of the lower back that are caused by compensating for the injury, such as limping. Pain or weakness of the foot during push-off following surgery. Chronic inflammation, scarring, and partial or complete fascia tear, occurring more often from repeated  injections.  TREATMENT  Treatment initially involves the use of ice and medication to help reduce pain and inflammation. The use of strengthening and stretching exercises may help reduce pain with activity, especially stretches of the Achilles tendon. These exercises may be performed at home or with a therapist. Your caregiver may recommend that you use heel cups of arch supports to help reduce stress on the plantar fascia. Occasionally, corticosteroid injections are given to reduce inflammation. If symptoms persist for greater than 6 months despite non-surgical (conservative), then surgery may be recommended.   MEDICATION  If pain medication is necessary, then nonsteroidal anti-inflammatory medications, such as aspirin and ibuprofen, or other minor pain relievers, such as acetaminophen, are often recommended. Do not take pain medication within 7 days before surgery. Prescription pain relievers may be given if deemed necessary by your caregiver. Use only as directed and only as much as you need. Corticosteroid injections may be given by your caregiver. These injections should be reserved for the most serious cases, because they may only be given a certain number of times.  HEAT AND COLD Cold treatment (icing) relieves pain and reduces inflammation. Cold treatment should be applied for 10 to 15 minutes every 2 to 3 hours for inflammation and pain and immediately after any activity that aggravates your symptoms. Use ice packs or massage the area with a piece of ice (ice massage). Heat treatment may be used prior to performing the stretching and strengthening activities prescribed by your caregiver, physical therapist, or athletic trainer. Use a heat pack or soak the injury in warm water.  SEEK IMMEDIATE MEDICAL CARE IF: Treatment seems to offer no benefit, or the condition worsens. Any medications produce adverse side effects.  EXERCISES- RANGE OF MOTION (ROM) AND STRETCHING EXERCISES - Plantar  Fasciitis (Heel Spur Syndrome) These exercises may help you when beginning to rehabilitate your injury. Your symptoms may resolve with or without further involvement from your physician, physical therapist or athletic trainer. While completing these exercises, remember:  Restoring tissue flexibility helps normal motion to return to the joints. This allows healthier, less painful movement and activity. An effective stretch should be held for at least 30 seconds. A stretch should never be painful. You should only feel a gentle lengthening or release in the stretched tissue.  RANGE OF MOTION - Toe Extension, Flexion Sit with your right / left leg crossed over your opposite knee. Grasp your toes and gently pull them back toward the top of your foot. You should feel a stretch on the bottom of your toes and/or foot. Hold this stretch for 10 seconds. Now, gently pull your toes toward the bottom of your foot. You should feel a stretch on the top of your toes and or foot. Hold this stretch for 10 seconds. Repeat  times. Complete this stretch 3 times per day.   RANGE OF MOTION - Ankle Dorsiflexion, Active Assisted Remove shoes and sit on a chair that is preferably not on a carpeted surface. Place right / left foot under knee. Extend your opposite leg for support. Keeping your heel down, slide your right / left foot back toward the chair until you feel a stretch at your ankle or calf. If you do not feel a stretch, slide your bottom forward to the edge of the chair, while still keeping your heel down. Hold this stretch for 10 seconds. Repeat 3 times. Complete this stretch 2 times per day.   STRETCH  Gastroc, Standing Place hands on wall. Extend right / left leg, keeping the front knee somewhat bent. Slightly point your toes inward on your back foot. Keeping your right / left heel on the floor and your knee straight, shift your weight toward the wall, not allowing your back to arch. You should feel a  gentle stretch in the right / left calf. Hold this position for 10 seconds. Repeat 3 times. Complete this stretch 2 times per day.  STRETCH  Soleus, Standing Place hands on wall. Extend right / left leg, keeping the other knee somewhat bent. Slightly point your toes inward on your back foot. Keep your right / left heel on the floor, bend your back knee, and slightly shift your weight over the back leg so that you feel a gentle stretch deep in your back calf. Hold this position for 10 seconds. Repeat 3 times. Complete this stretch 2 times per day.  STRETCH  Gastrocsoleus, Standing  Note: This exercise can place a lot of stress on your foot and ankle. Please complete this exercise only if specifically instructed by your caregiver.  Place the ball of your right / left foot on a step, keeping your other foot firmly on the same step. Hold on to the wall or a rail for balance. Slowly lift your other foot, allowing your body weight to press your heel down over the edge of the step. You should feel a stretch in your right / left calf. Hold this position for 10 seconds. Repeat this exercise with a slight bend in your right / left knee. Repeat 3 times. Complete this stretch 2 times per day.   STRENGTHENING EXERCISES - Plantar Fasciitis (Heel Spur Syndrome)  These exercises may help you when beginning to rehabilitate your injury. They may resolve your symptoms with or without further involvement   from your physician, physical therapist or athletic trainer. While completing these exercises, remember:  Muscles can gain both the endurance and the strength needed for everyday activities through controlled exercises. Complete these exercises as instructed by your physician, physical therapist or athletic trainer. Progress the resistance and repetitions only as guided.  STRENGTH - Towel Curls Sit in a chair positioned on a non-carpeted surface. Place your foot on a towel, keeping your heel on the  floor. Pull the towel toward your heel by only curling your toes. Keep your heel on the floor. Repeat 3 times. Complete this exercise 2 times per day.  STRENGTH - Ankle Inversion Secure one end of a rubber exercise band/tubing to a fixed object (table, pole). Loop the other end around your foot just before your toes. Place your fists between your knees. This will focus your strengthening at your ankle. Slowly, pull your big toe up and in, making sure the band/tubing is positioned to resist the entire motion. Hold this position for 10 seconds. Have your muscles resist the band/tubing as it slowly pulls your foot back to the starting position. Repeat 3 times. Complete this exercises 2 times per day.  Document Released: 04/18/2005 Document Revised: 07/11/2011 Document Reviewed: 07/31/2008 [employer] Patient Information 2014 [employer]  

## 2022-03-28 ENCOUNTER — Other Ambulatory Visit: Payer: Self-pay | Admitting: Podiatry

## 2022-03-28 DIAGNOSIS — M7751 Other enthesopathy of right foot: Secondary | ICD-10-CM

## 2022-03-28 DIAGNOSIS — M216X9 Other acquired deformities of unspecified foot: Secondary | ICD-10-CM

## 2022-03-28 DIAGNOSIS — M7731 Calcaneal spur, right foot: Secondary | ICD-10-CM

## 2022-03-28 DIAGNOSIS — M722 Plantar fascial fibromatosis: Secondary | ICD-10-CM

## 2022-04-09 ENCOUNTER — Other Ambulatory Visit: Payer: Self-pay | Admitting: Podiatry

## 2022-04-09 DIAGNOSIS — M7751 Other enthesopathy of right foot: Secondary | ICD-10-CM

## 2022-04-09 DIAGNOSIS — M722 Plantar fascial fibromatosis: Secondary | ICD-10-CM

## 2022-05-24 DIAGNOSIS — Z1231 Encounter for screening mammogram for malignant neoplasm of breast: Secondary | ICD-10-CM | POA: Diagnosis not present

## 2022-05-24 DIAGNOSIS — Z6832 Body mass index (BMI) 32.0-32.9, adult: Secondary | ICD-10-CM | POA: Diagnosis not present

## 2022-05-24 DIAGNOSIS — Z01419 Encounter for gynecological examination (general) (routine) without abnormal findings: Secondary | ICD-10-CM | POA: Diagnosis not present

## 2022-06-07 DIAGNOSIS — Z1211 Encounter for screening for malignant neoplasm of colon: Secondary | ICD-10-CM | POA: Diagnosis not present

## 2022-06-07 IMAGING — MG MM DIGITAL SCREENING BILAT W/ TOMO AND CAD
8 series · 8 of 24 positions shown · non-contrast
Comparison: Previous exam(s).

CLINICAL DATA: Screening.

EXAM:
DIGITAL SCREENING BILATERAL MAMMOGRAM WITH TOMOSYNTHESIS AND CAD
TECHNIQUE: Bilateral screening digital craniocaudal and mediolateral oblique
mammograms were obtained. Bilateral screening digital breast
tomosynthesis was performed. The images were evaluated with
computer-aided detection.

[L MLO synth-2D]
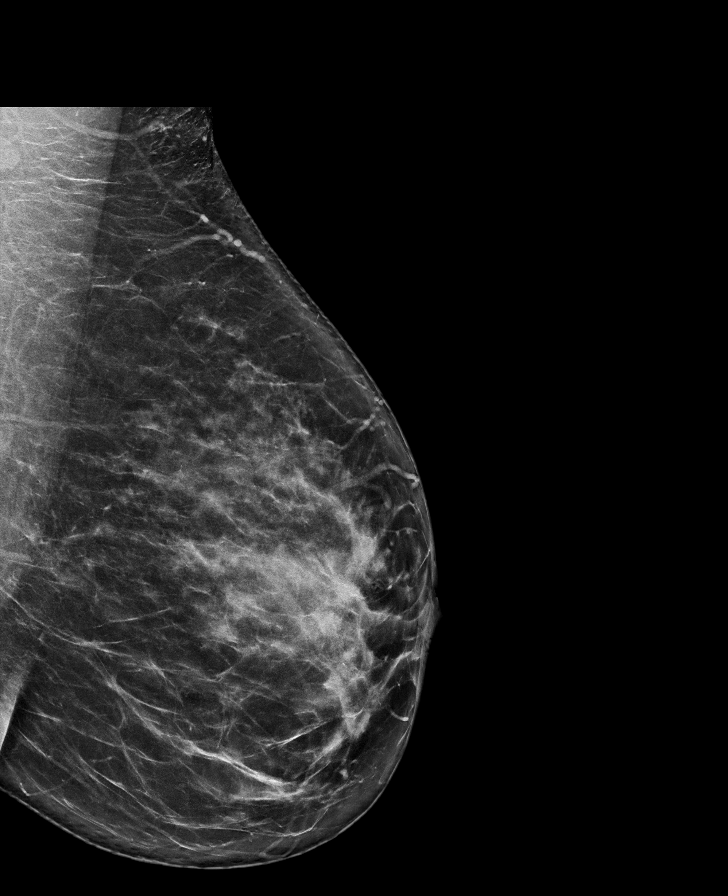

[R CC synth-2D]
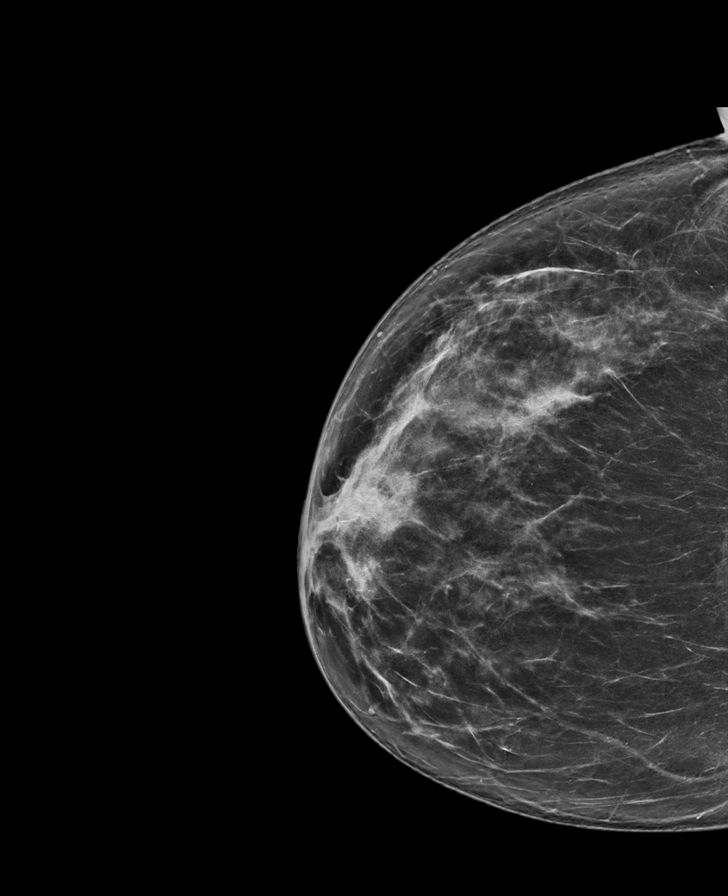

[R MLO synth-2D]
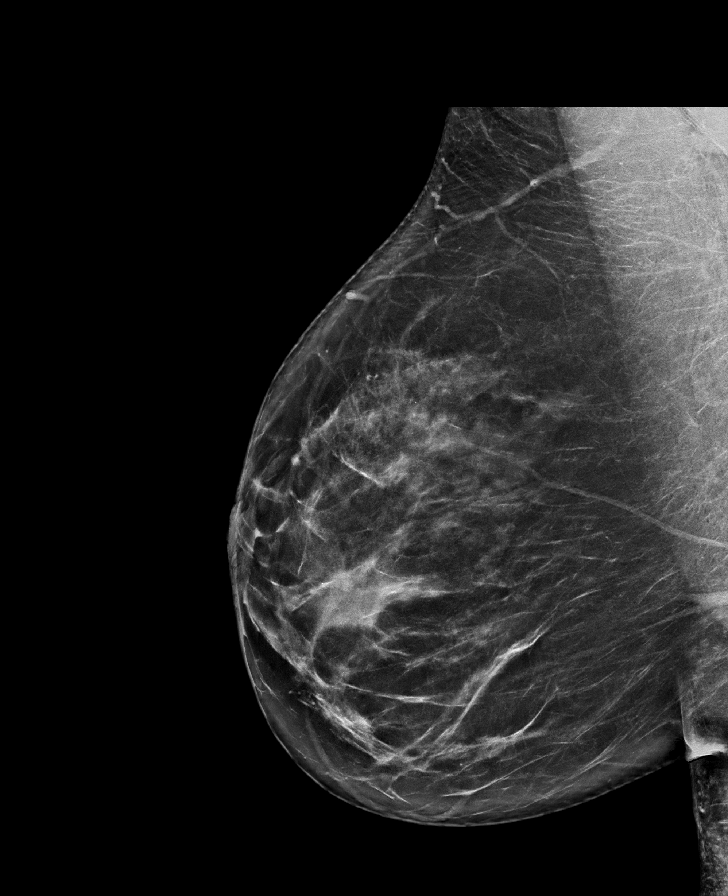

[L CC synth-2D]
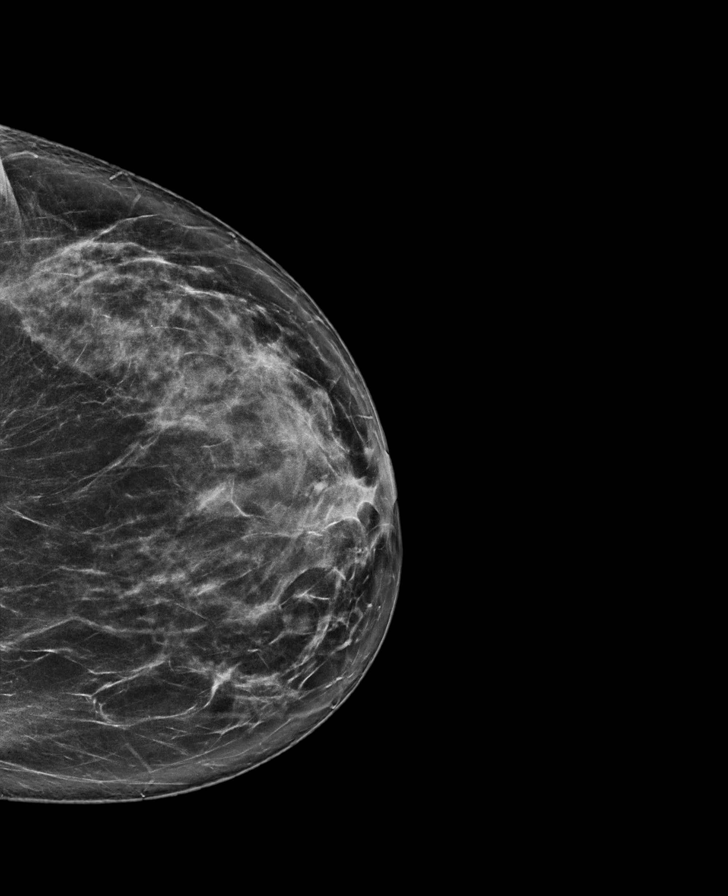

[L CC tomo · tomo slice 41/81.0]
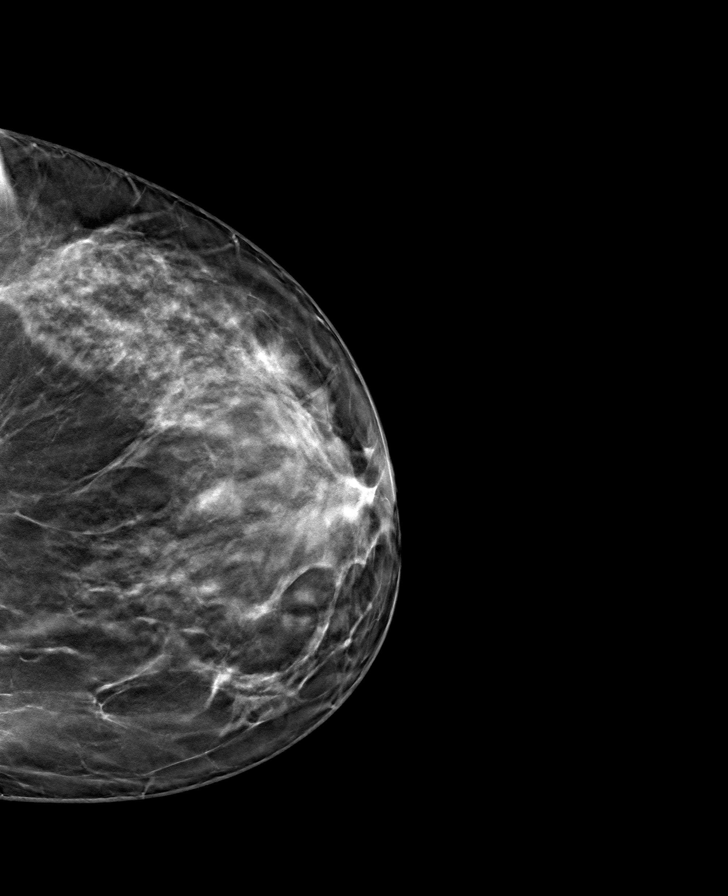

[L MLO tomo · tomo slice 43/85.0]
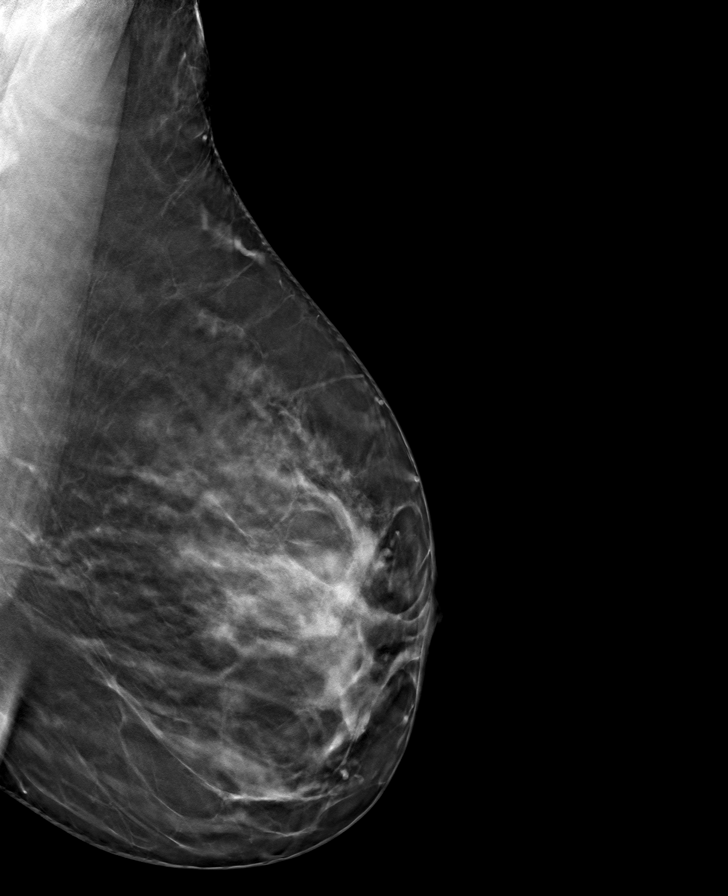

[R MLO tomo · tomo slice 46/91.0]
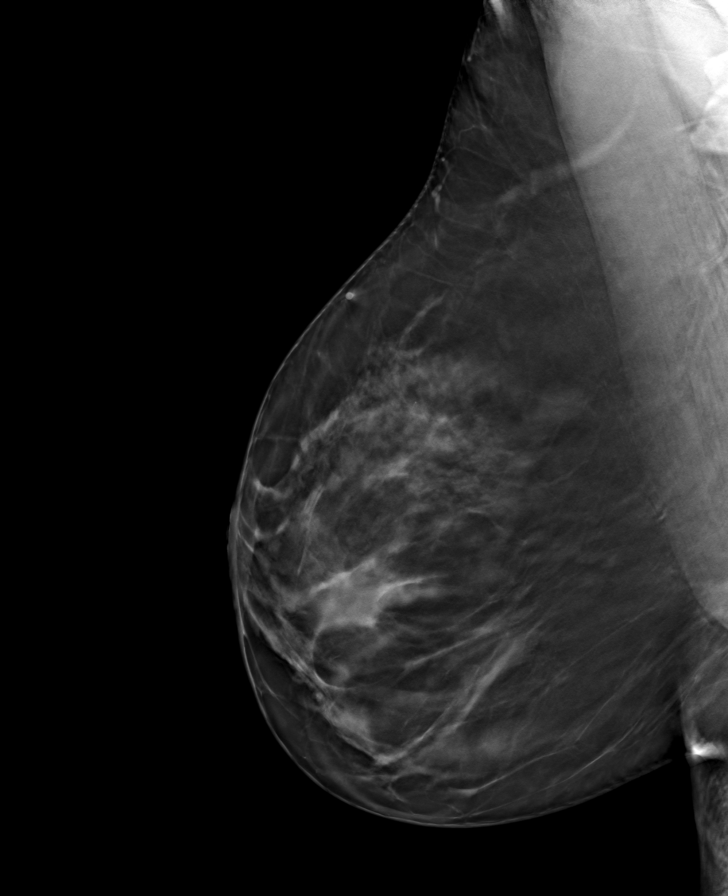

[R CC tomo · tomo slice 41/81.0]
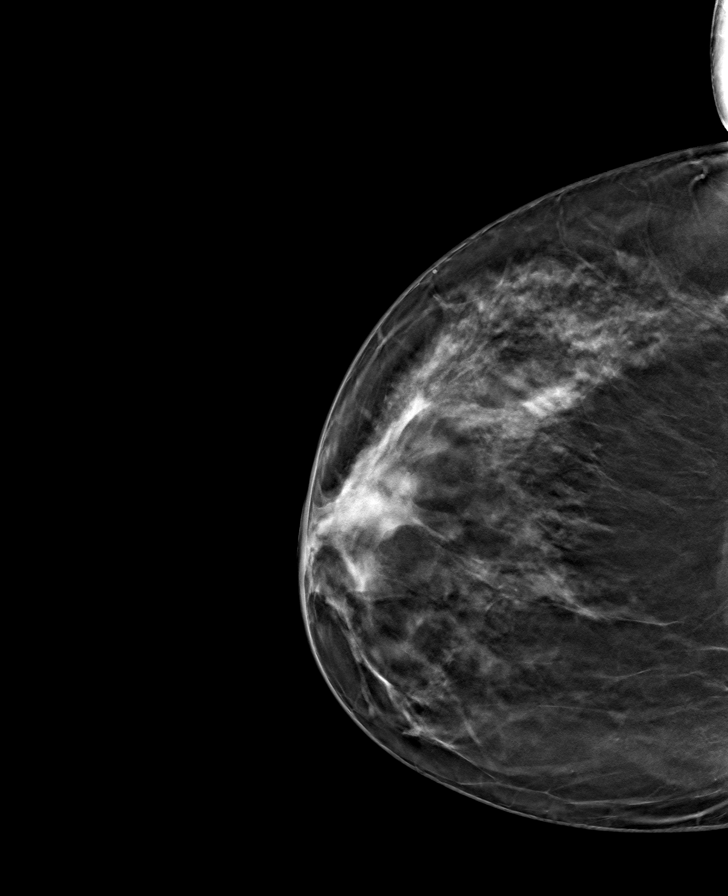

[8 of 24 positions shown; findings below may reference images not displayed]

ACR Breast Density Category c: The breast tissue is heterogeneously
dense, which may obscure small masses.
FINDINGS: There are no findings suspicious for malignancy.
IMPRESSION: No mammographic evidence of malignancy. A result letter of this
screening mammogram will be mailed directly to the patient.

RECOMMENDATION:
Screening mammogram in one year. (Code:Q3-W-BC3)

BI-RADS CATEGORY  1: Negative.

## 2022-10-21 IMAGING — US US THYROID
1 series · 13 of 25 positions shown · non-contrast
Comparison: US Thyroid, 07/28/2020 through 12/15/2016.

CLINICAL DATA: Prior ultrasound follow-up.  RIGHT nodule

EXAM:
THYROID ULTRASOUND
TECHNIQUE: Ultrasound examination of the thyroid gland and adjacent soft
tissues was performed.

[Series 1: us thyroid · 0.07mm/px · 13 of 39 slices shown]
[im 1/39]
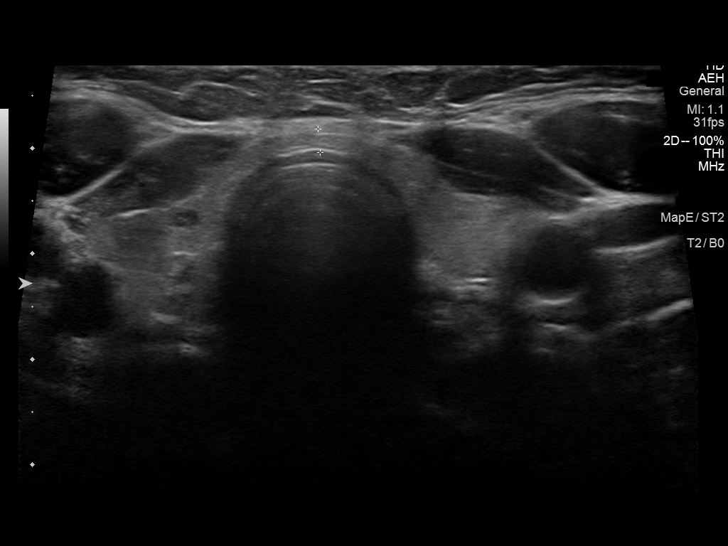
[im 4/39]
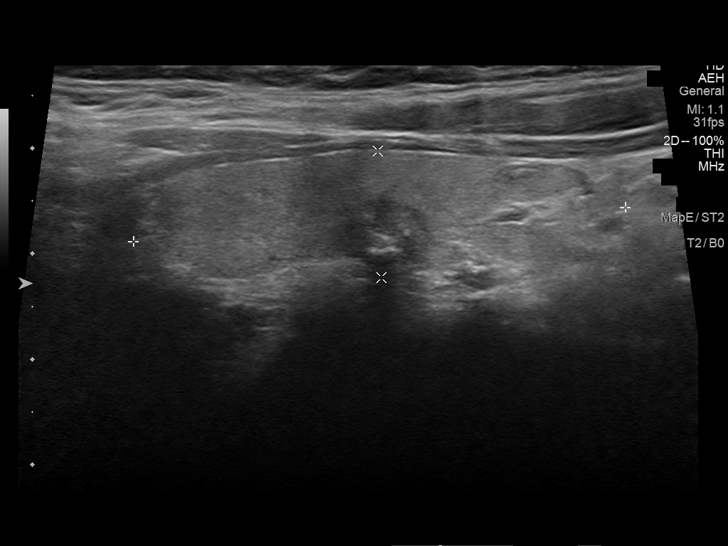
[im 7/39]
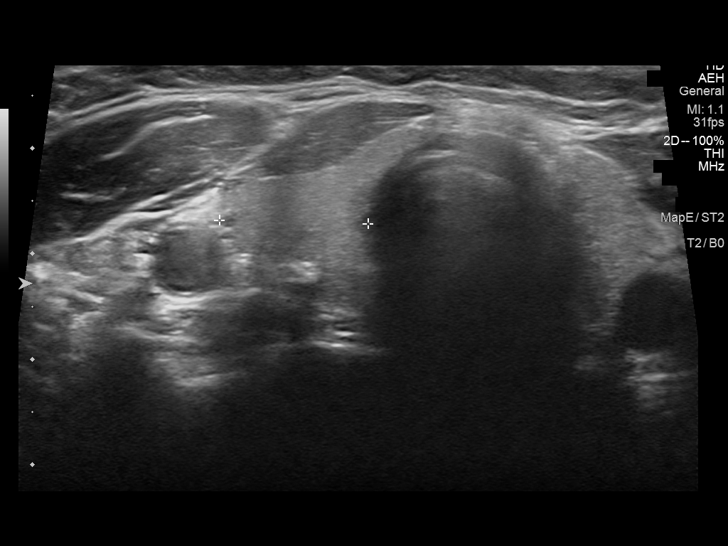
[im 10/39]
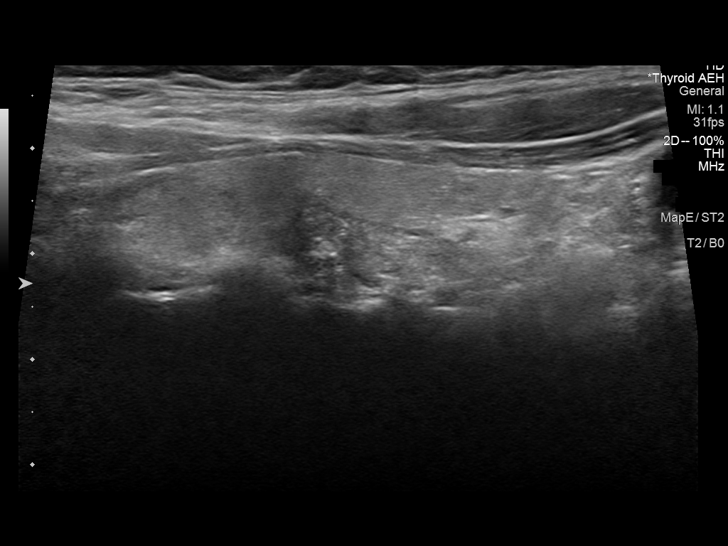
[im 13/39]
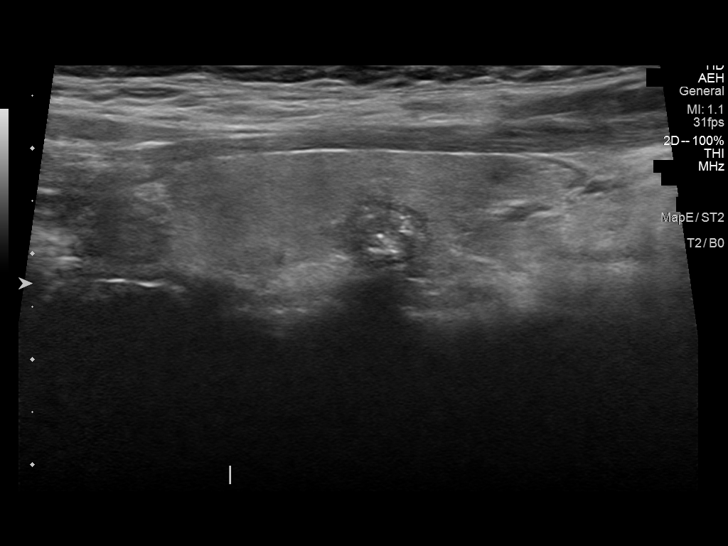
[im 16/39]
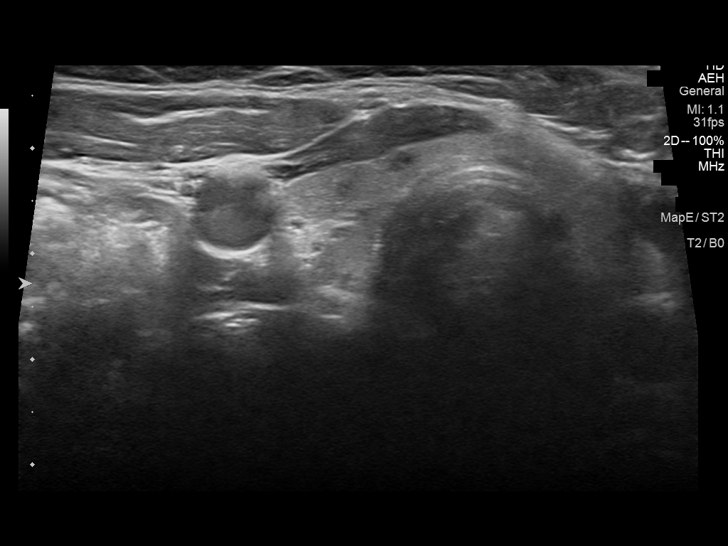
[im 20/39]
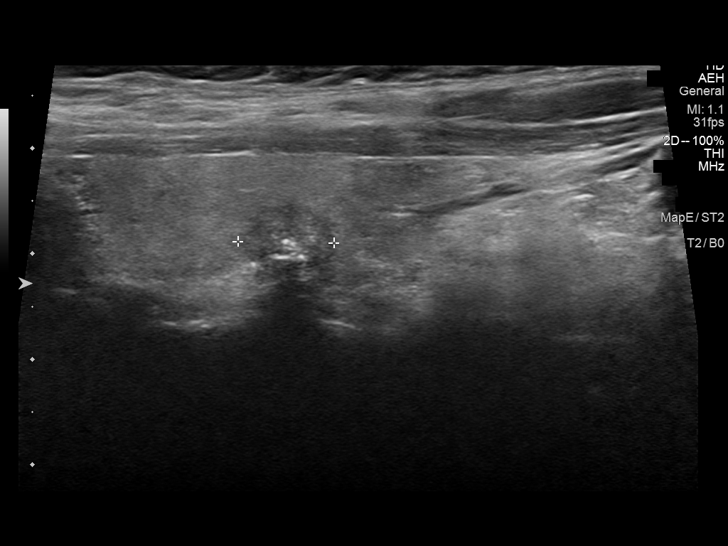
[im 23/39]
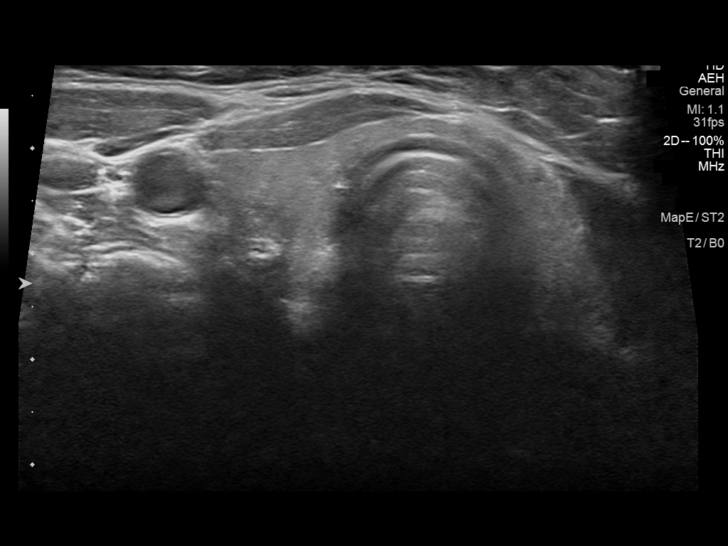
[im 26/39]
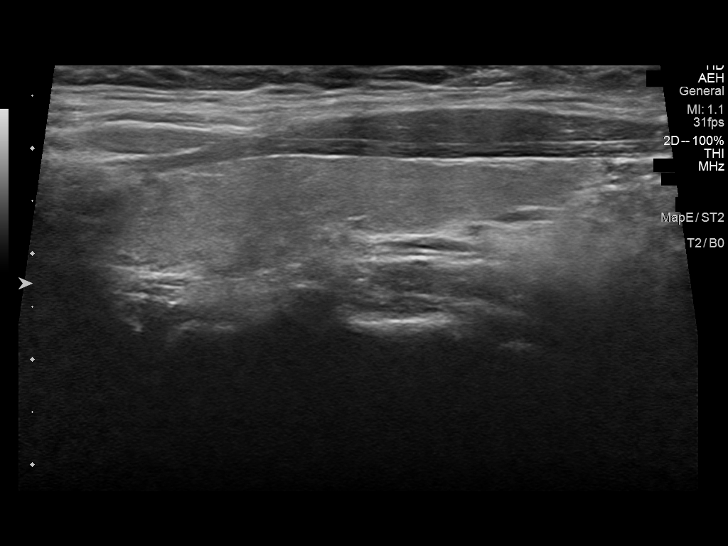
[im 29/39]
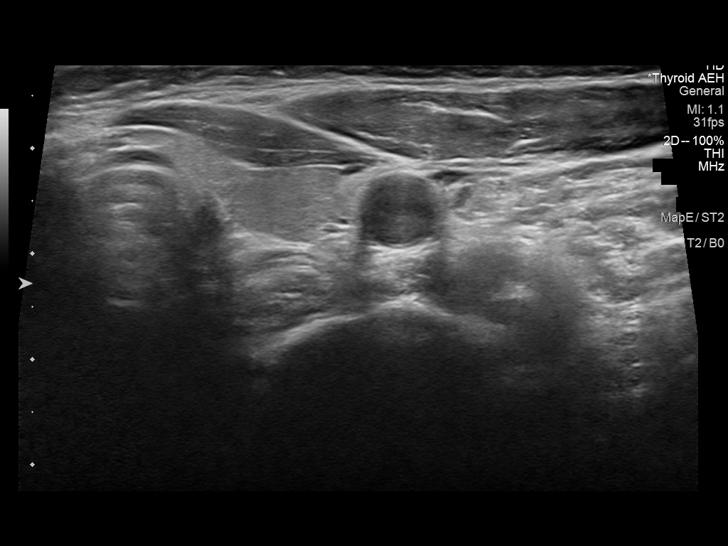
[im 32/39]
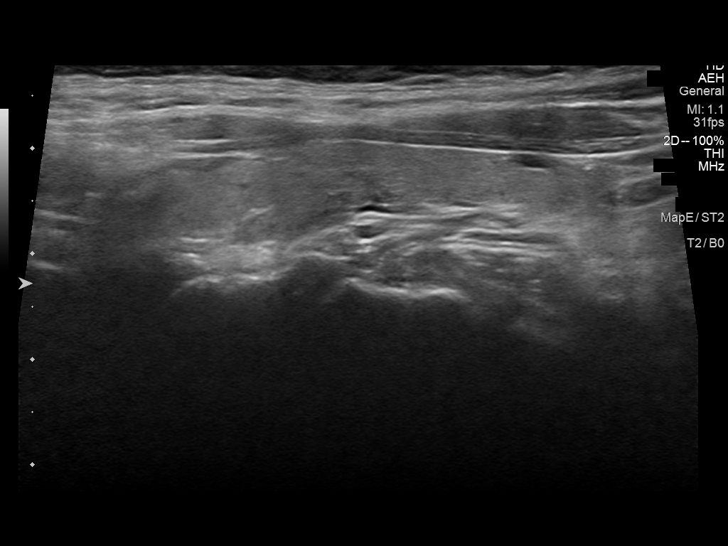
[im 35/39]
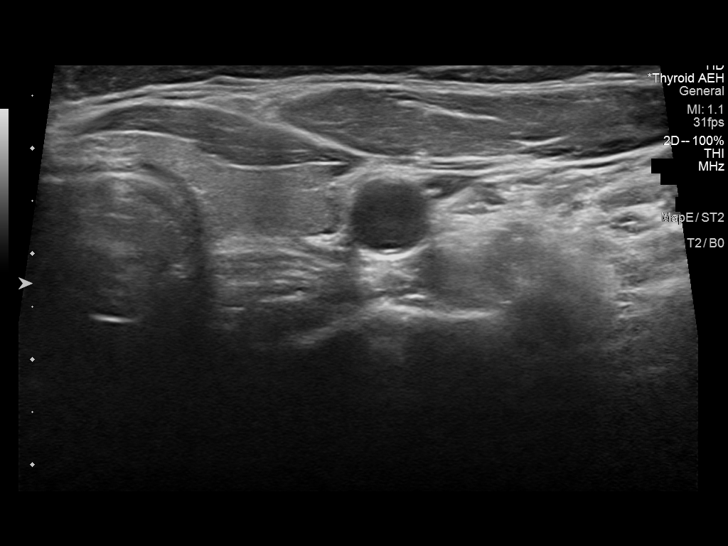
[im 39/39]
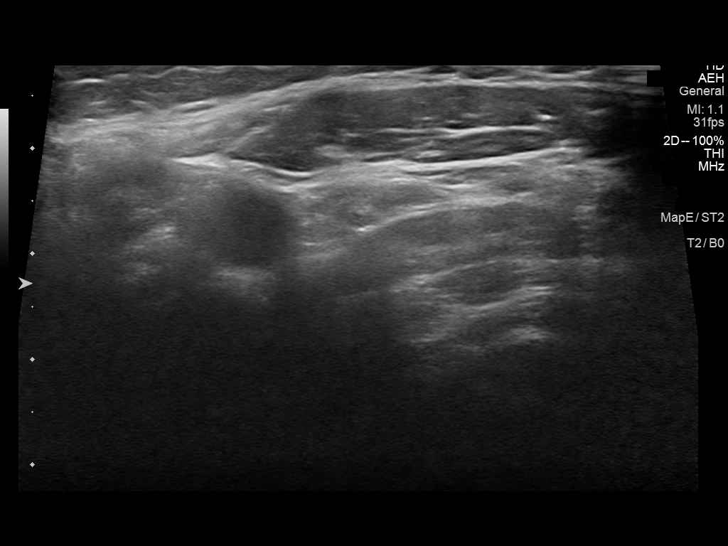

[13 of 25 positions shown; findings below may reference images not displayed]

FINDINGS: Parenchymal Echotexture: Normal

Isthmus: 0.2 cm

Right lobe: 4.7 x 1.4 x 1.4 cm

Left lobe: 5.0 x 1.0 x 1.2 cm

_________________________________________________________

Estimated total number of nodules >/= 1 cm: 0

Number of spongiform nodules >/=  2 cm not described below (TR1): 0

Number of mixed cystic and solid nodules >/= 1.5 cm not described
below (TR2): 0

_________________________________________________________

Nodule # 1:

Location: RIGHT; Mid

Maximum size: 0.9 cm; Other 2 dimensions: 0.7 x 0.6 cm previously
0.8 x 0.7 x 0.7 cm

Composition: solid/almost completely solid (2)

Echogenicity: hypoechoic (2)

Shape: not taller-than-wide (0)

Margins: ill-defined (0)

Echogenic foci: punctate echogenic foci (3)

ACR TI-RADS total points: 7.

ACR TI-RADS risk category: TR5 (>/= 7 points).

ACR TI-RADS recommendations:

*Given size (>/= 0.5 - 0.9 cm) and appearance, a follow-up
ultrasound in 1 year should be considered based on TI-RADS criteria.

_________________________________________________________

No cervical adenopathy or abnormal fluid collection within the
imaged neck.
IMPRESSION: 1. Nonenlarged thyroid with single sub-8 cm RIGHT thyroid nodule
2. 0.9 cm RIGHT mid TR-5 thyroid nodule. This appears relatively
unchanged since [DATE] comparison (5 years).
Annual follow-up has been performed for 5 years, demonstrating
stability and therefore benignity of this nodule. No additional
follow-up is recommended based on TI-RADS criteria.

The above is in keeping with the ACR TI-RADS recommendations - [HOSPITAL] 8270;[DATE].
